# Patient Record
Sex: Female | Born: 1955 | Race: White | Hispanic: No | Marital: Married | Smoking: Never smoker
Health system: Southern US, Academic
[De-identification: ages and names within clinical notes are randomized; demographics above are authoritative.]

## PROBLEM LIST (undated history)

## (undated) DIAGNOSIS — M545 Low back pain, unspecified: Secondary | ICD-10-CM

## (undated) DIAGNOSIS — Z5189 Encounter for other specified aftercare: Secondary | ICD-10-CM

## (undated) DIAGNOSIS — R5383 Other fatigue: Secondary | ICD-10-CM

## (undated) DIAGNOSIS — N390 Urinary tract infection, site not specified: Secondary | ICD-10-CM

## (undated) DIAGNOSIS — R3915 Urgency of urination: Secondary | ICD-10-CM

## (undated) DIAGNOSIS — R10A0 Flank pain, unspecified side: Secondary | ICD-10-CM

## (undated) DIAGNOSIS — R001 Bradycardia, unspecified: Secondary | ICD-10-CM

## (undated) DIAGNOSIS — R109 Unspecified abdominal pain: Secondary | ICD-10-CM

## (undated) DIAGNOSIS — E875 Hyperkalemia: Secondary | ICD-10-CM

## (undated) DIAGNOSIS — U071 COVID-19: Secondary | ICD-10-CM

## (undated) DIAGNOSIS — R110 Nausea: Secondary | ICD-10-CM

## (undated) DIAGNOSIS — Z9889 Other specified postprocedural states: Secondary | ICD-10-CM

## (undated) DIAGNOSIS — R0789 Other chest pain: Secondary | ICD-10-CM

## (undated) DIAGNOSIS — M25569 Pain in unspecified knee: Secondary | ICD-10-CM

## (undated) HISTORY — DX: Other fatigue: R53.83

## (undated) HISTORY — DX: Hyperkalemia: E87.5

## (undated) HISTORY — DX: Other specified postprocedural states: Z98.890

## (undated) HISTORY — DX: Nausea: R11.0

## (undated) HISTORY — DX: Pain in unspecified knee: M25.569

## (undated) HISTORY — DX: Flank pain, unspecified side: R10.A0

## (undated) HISTORY — DX: Other chest pain: R07.89

## (undated) HISTORY — DX: Urinary tract infection, site not specified: N39.0

## (undated) HISTORY — DX: Urgency of urination: R39.15

## (undated) HISTORY — DX: Low back pain, unspecified: M54.50

## (undated) HISTORY — DX: Unspecified abdominal pain: R10.9

## (undated) HISTORY — DX: COVID-19: U07.1

## (undated) HISTORY — DX: Bradycardia, unspecified: R00.1

---

## 1988-06-16 HISTORY — PX: HX GALL BLADDER SURGERY/CHOLE: SHX55

## 1998-09-15 ENCOUNTER — Emergency Department (HOSPITAL_COMMUNITY): Payer: Self-pay

## 2003-06-17 HISTORY — PX: HX HYSTERECTOMY: SHX81

## 2003-06-17 HISTORY — PX: BILATERAL OOPHORECTOMY: SHX1221

## 2003-06-17 HISTORY — PX: HX DILATION AND CURETTAGE: SHX78

## 2003-06-17 HISTORY — PX: HX APPENDECTOMY: SHX54

## 2012-03-01 DIAGNOSIS — R42 Dizziness and giddiness: Secondary | ICD-10-CM | POA: Insufficient documentation

## 2012-03-01 DIAGNOSIS — R079 Chest pain, unspecified: Secondary | ICD-10-CM | POA: Insufficient documentation

## 2012-03-01 DIAGNOSIS — E669 Obesity, unspecified: Secondary | ICD-10-CM | POA: Insufficient documentation

## 2012-03-01 DIAGNOSIS — R5383 Other fatigue: Secondary | ICD-10-CM

## 2012-03-01 DIAGNOSIS — E661 Drug-induced obesity: Secondary | ICD-10-CM | POA: Insufficient documentation

## 2012-03-01 DIAGNOSIS — R0609 Other forms of dyspnea: Secondary | ICD-10-CM | POA: Insufficient documentation

## 2012-03-01 DIAGNOSIS — G4730 Sleep apnea, unspecified: Secondary | ICD-10-CM | POA: Insufficient documentation

## 2012-03-01 DIAGNOSIS — E785 Hyperlipidemia, unspecified: Secondary | ICD-10-CM | POA: Insufficient documentation

## 2012-03-01 DIAGNOSIS — E559 Vitamin D deficiency, unspecified: Secondary | ICD-10-CM | POA: Insufficient documentation

## 2012-03-01 DIAGNOSIS — Z8249 Family history of ischemic heart disease and other diseases of the circulatory system: Secondary | ICD-10-CM | POA: Insufficient documentation

## 2012-03-01 DIAGNOSIS — R5381 Other malaise: Secondary | ICD-10-CM | POA: Insufficient documentation

## 2012-03-01 DIAGNOSIS — R9431 Abnormal electrocardiogram [ECG] [EKG]: Secondary | ICD-10-CM | POA: Insufficient documentation

## 2012-03-01 DIAGNOSIS — R0683 Snoring: Secondary | ICD-10-CM | POA: Insufficient documentation

## 2012-03-01 HISTORY — DX: Other fatigue: R53.83

## 2012-03-01 HISTORY — DX: Snoring: R06.83

## 2012-03-01 HISTORY — DX: Other malaise: R53.81

## 2012-03-01 HISTORY — DX: Dizziness and giddiness: R42

## 2012-03-01 HISTORY — DX: Chest pain, unspecified: R07.9

## 2012-03-01 HISTORY — DX: Other forms of dyspnea: R06.09

## 2012-03-01 HISTORY — DX: Family history of ischemic heart disease and other diseases of the circulatory system: Z82.49

## 2018-04-15 DIAGNOSIS — I10 Essential (primary) hypertension: Secondary | ICD-10-CM | POA: Insufficient documentation

## 2018-04-15 DIAGNOSIS — I350 Nonrheumatic aortic (valve) stenosis: Secondary | ICD-10-CM | POA: Insufficient documentation

## 2018-04-15 DIAGNOSIS — G4710 Hypersomnia, unspecified: Secondary | ICD-10-CM | POA: Insufficient documentation

## 2018-08-11 IMAGING — MR MRI JOINT OF LOWER EXTREMITY WITHOUT CONTRAST LT
6 series · 40 of 40 positions shown · non-contrast
Comparison: None.

EXAM:  MRI JOINT OF LOWER EXTREMITY WITHOUT CONTRAST LT KNEE
INDICATION: Left knee pain, fall several months ago.
TECHNIQUE: Noncontrast multiplanar, multisequential MRI of the left knee joint was performed.

[Series 7: s-map · axial · left · 7.8mm · 3.91mm/px · z∈[-138,+166]mm · 15 of 80 slices shown]
[im 1/80]
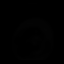
[im 6/80]
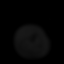
[im 12/80]
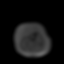
[im 17/80]
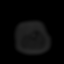
[im 23/80]
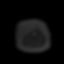
[im 29/80]
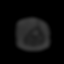
[im 34/80]
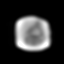
[im 40/80]
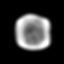
[im 46/80]
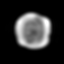
[im 51/80]
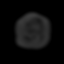
[im 57/80]
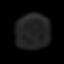
[im 63/80]
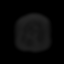
[im 68/80]
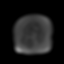
[im 74/80]
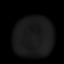
[im 80/80]
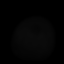

[Series 8: PD fat-sat · axial · left · 4.0mm · 0.37mm/px · z∈[-42,+89]mm · 5 of 30 slices shown (1 of 3)]
[im 1/30]
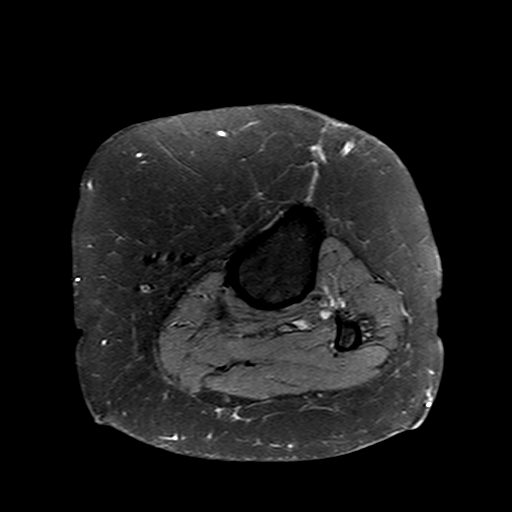
[im 8/30]
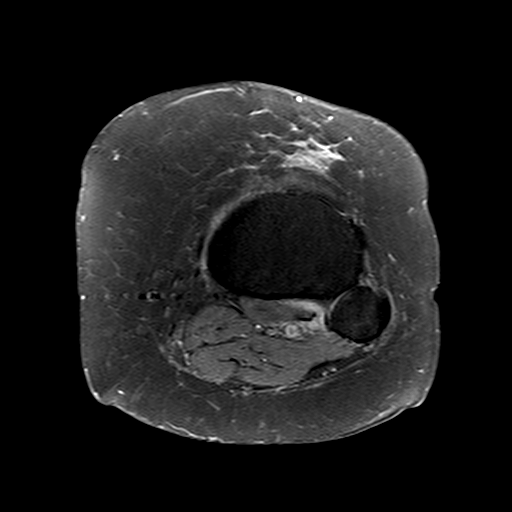
[im 15/30]
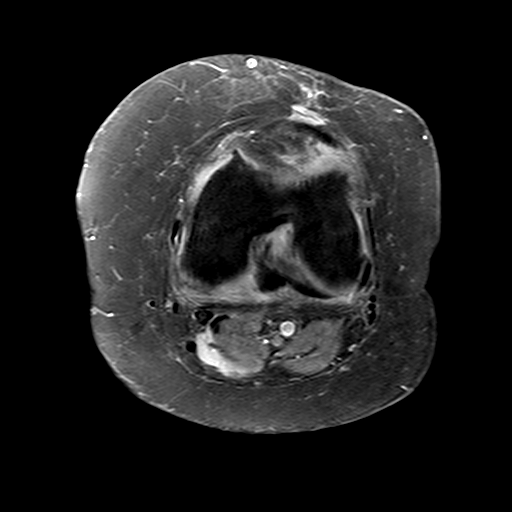
[im 22/30]
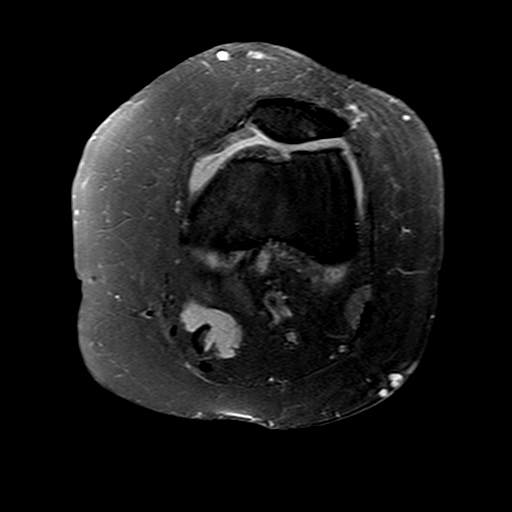
[im 30/30]
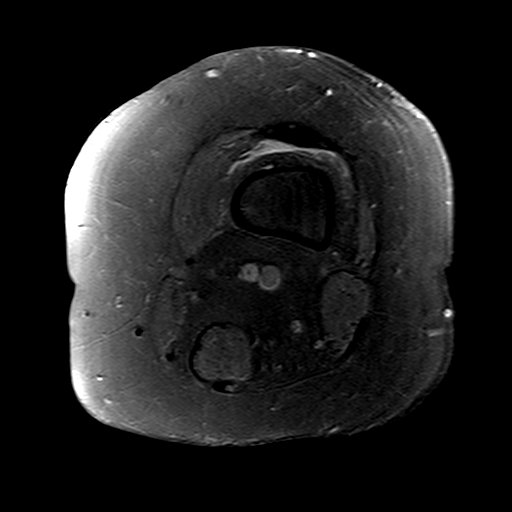

[Series 9: T1 · sagittal · left · 3.0mm · 0.42mm/px · 5 of 30 slices shown]
[im 1/30]
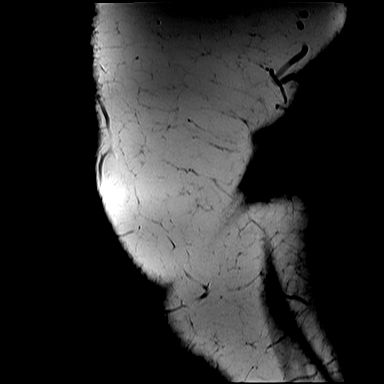
[im 8/30]
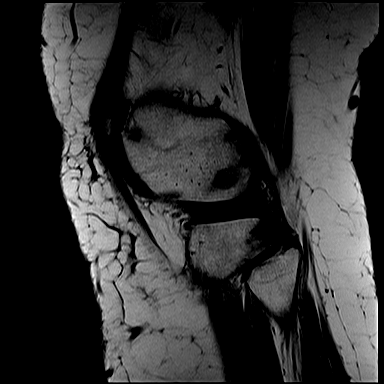
[im 15/30]
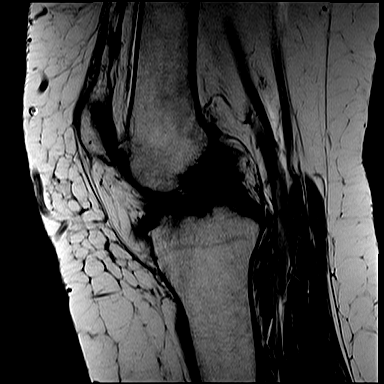
[im 22/30]
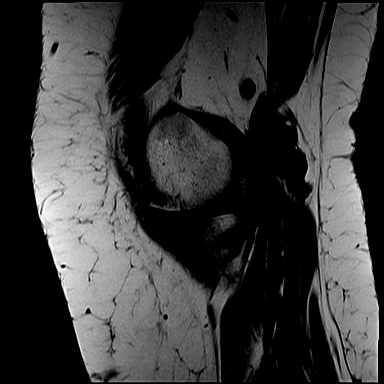
[im 30/30]
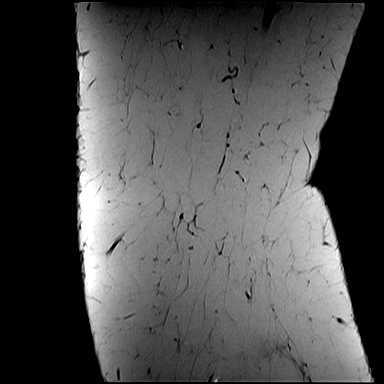

[Series 10: PD fat-sat · sagittal · left · 3.0mm · 0.50mm/px · 5 of 30 slices shown (2 of 3)]
[im 1/30]
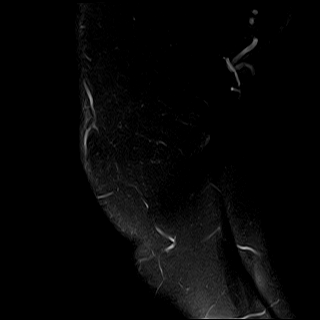
[im 8/30]
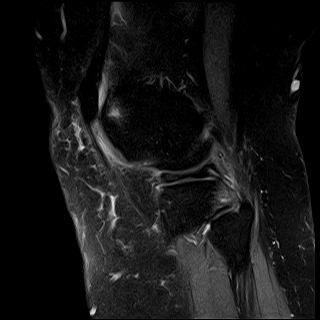
[im 15/30]
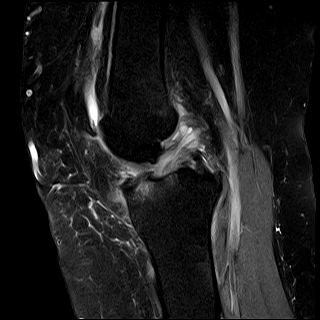
[im 22/30]
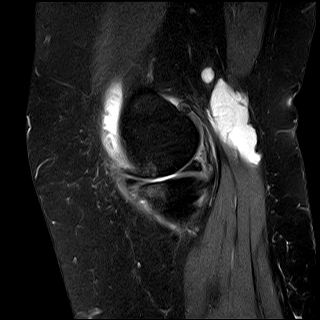
[im 30/30]
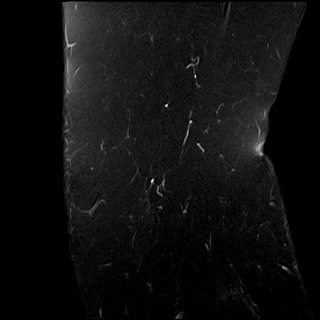

[Series 11: STIR · coronal · left · 3.0mm · 0.50mm/px · 5 of 29 slices shown]
[im 1/29]
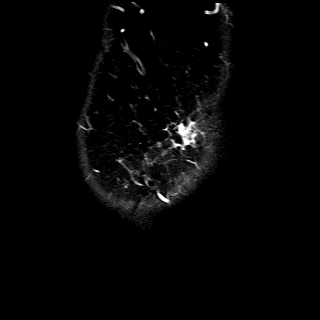
[im 8/29]
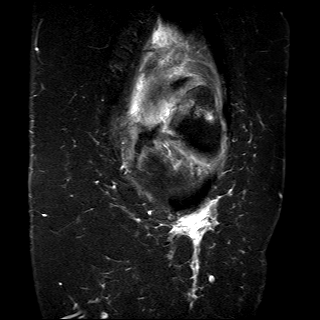
[im 15/29]
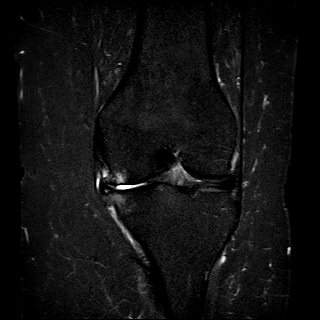
[im 22/29]
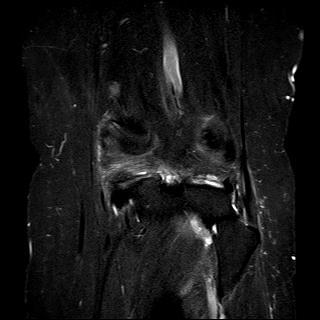
[im 29/29]
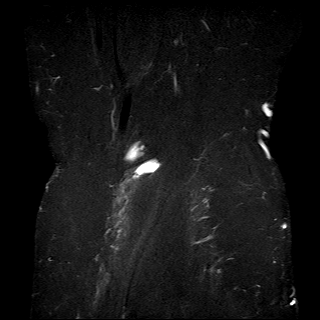

[Series 12: PD fat-sat · coronal · left · 3.0mm · 0.50mm/px · 5 of 29 slices shown (3 of 3)]
[im 1/29]
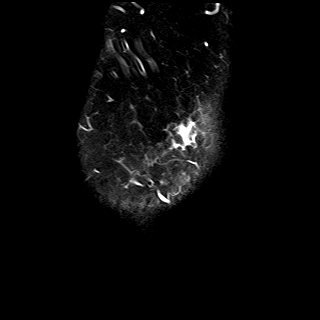
[im 8/29]
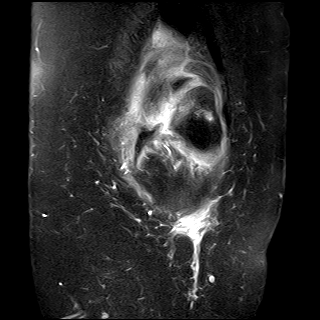
[im 15/29]
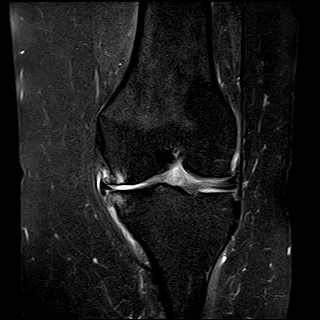
[im 22/29]
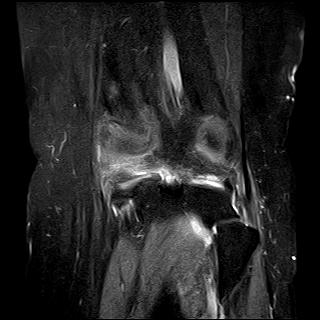
[im 29/29]
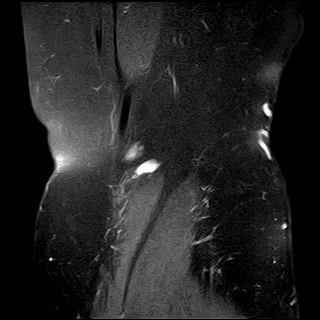

[40 of 40 positions shown; findings below may reference images not displayed]

FINDINGS: There is a complex medial meniscus tear.  There is also suggestion of a subtle tear involving the anterior horn of the lateral meniscus.  Cruciate and collateral ligaments are intact, within normal limits in morphology and signal intensity.  There is grade 4 chondromalacia of the medial tibiofemoral and patellofemoral articulations.  Mild subarticular cystic changes are also noted within the femoral trochlea and lateral patellar facet.  Extensor mechanism is intact.  Capsular attachments appear unremarkable.  There is a small suprapatellar effusion.  There is also a 7 cm Baker's cyst.
IMPRESSION: A complex medial meniscus tear and suggestion of a subtle tear involving the anterior horn of the lateral meniscus. 

Grade 4 chondromalacia of the medial tibiofemoral and patellofemoral articulations.  All these findings appear to be chronic in nature.

## 2018-08-11 IMAGING — MR MRI JOINT OF LOWER EXTREMITY WITHOUT CONTRAST RT
4 of 6 series · 22 of 40 positions shown · non-contrast
Comparison: None.

EXAM:  MRI JOINT OF LOWER EXTREMITY WITHOUT CONTRAST RT ANKLE
INDICATION: Right ankle pain, fall several months ago.
TECHNIQUE: Noncontrast multiplanar, multisequential MRI of the right ankle joint was performed.

[Series 10: T1 · axial · right · 4.5mm · 0.29mm/px · z∈[-64,+71]mm · 8 of 28 slices shown (1 of 3)]
[im 1/28]
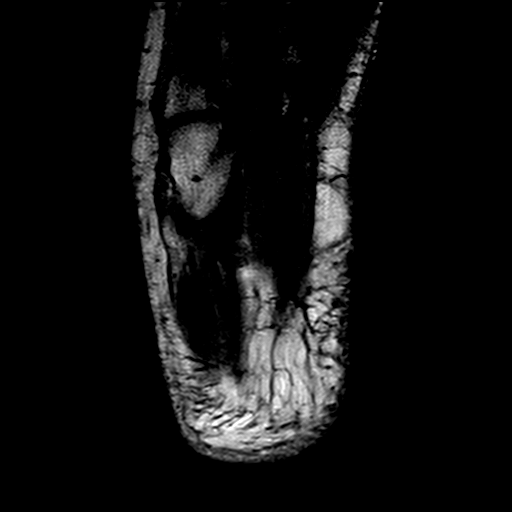
[im 4/28]
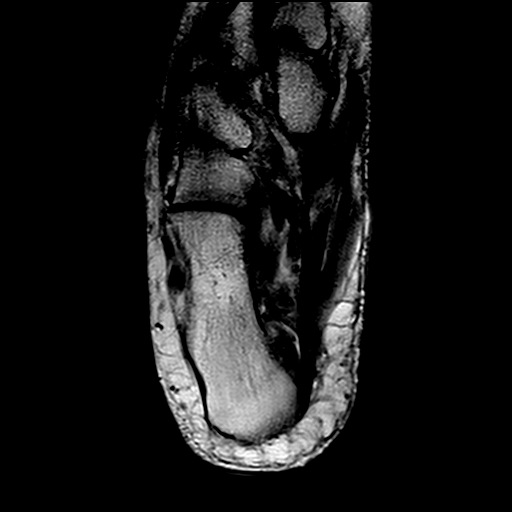
[im 8/28]
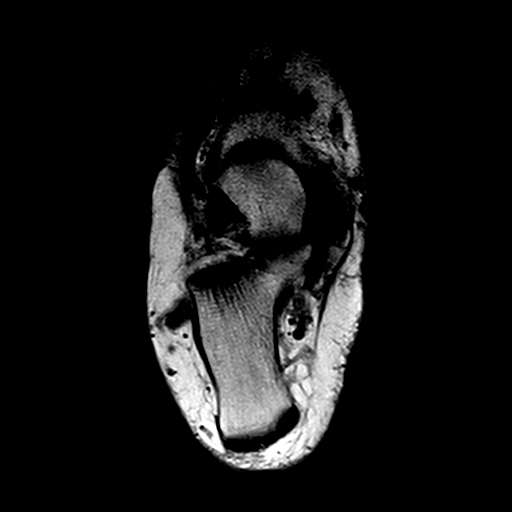
[im 12/28]
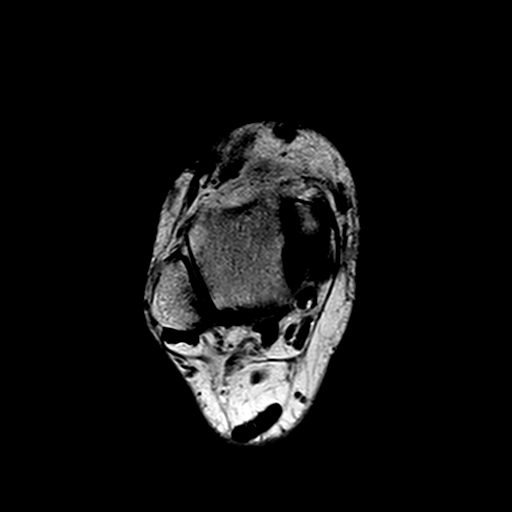
[im 16/28]
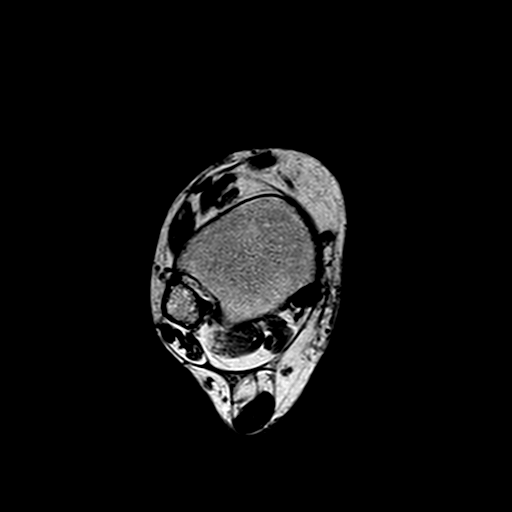
[im 20/28]
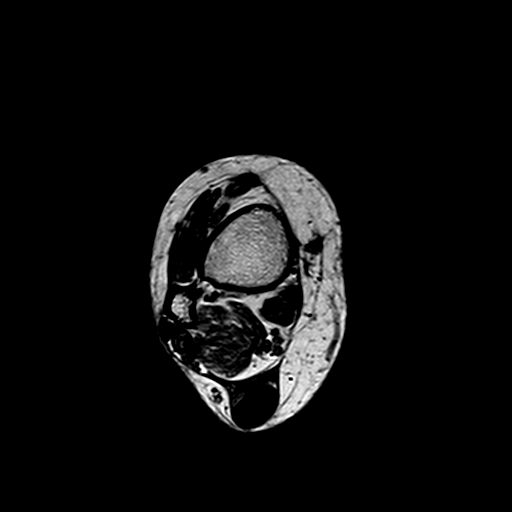
[im 24/28]
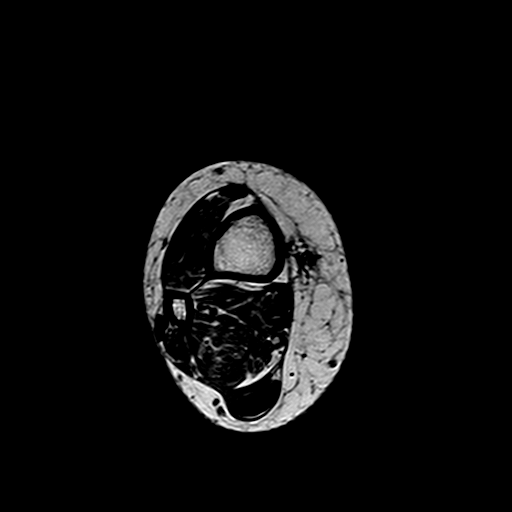
[im 28/28]
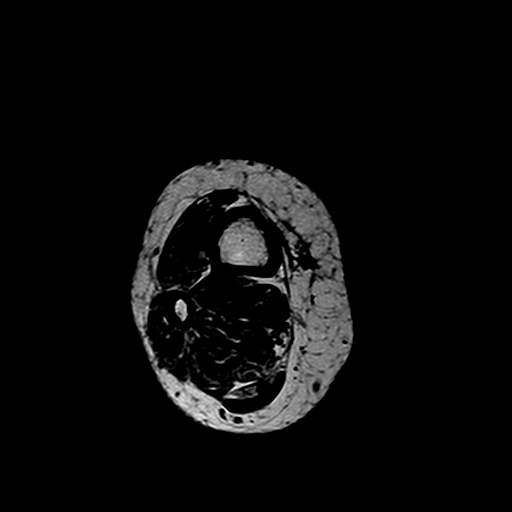

[Series 12: T2 fat-sat · axial · right · 4.5mm · 0.33mm/px · z∈[-64,+71]mm · 8 of 28 slices shown]
[im 1/28]
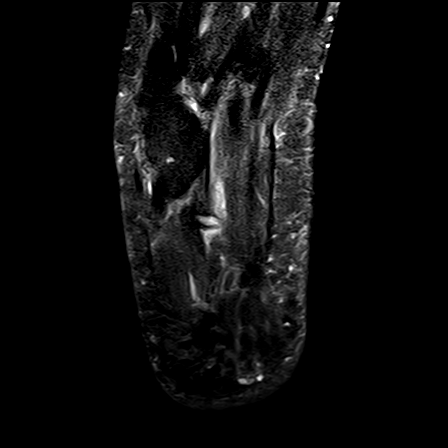
[im 4/28]
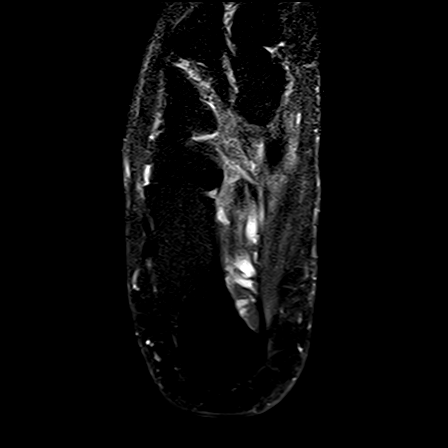
[im 8/28]
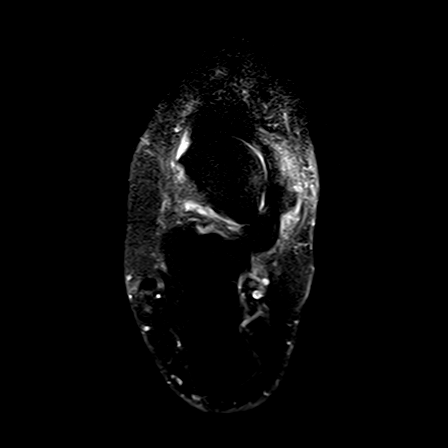
[im 12/28]
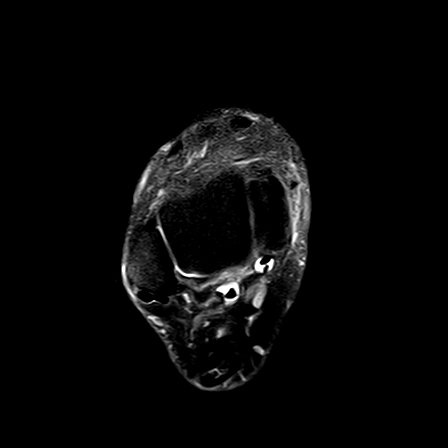
[im 16/28]
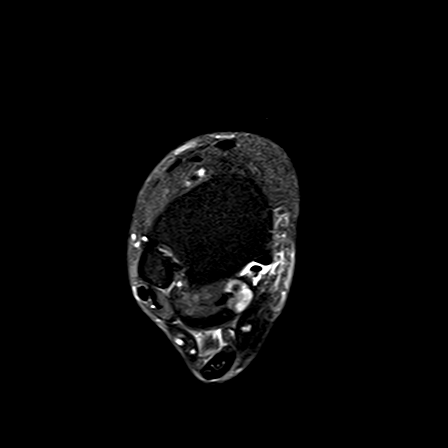
[im 20/28]
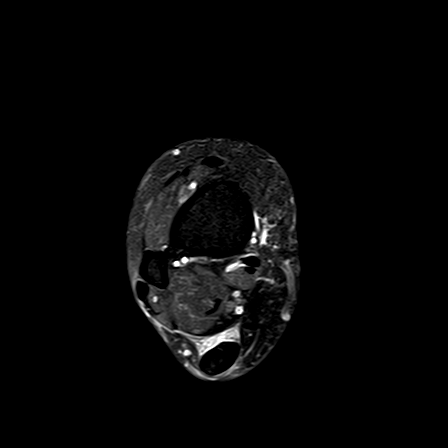
[im 24/28]
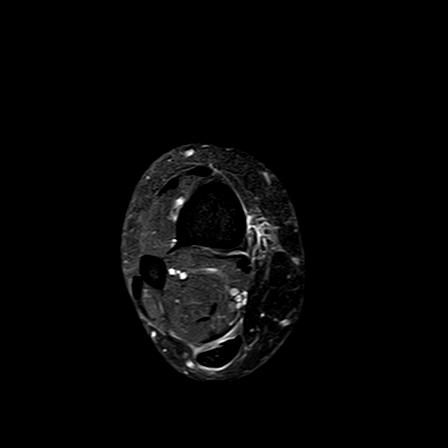
[im 28/28]
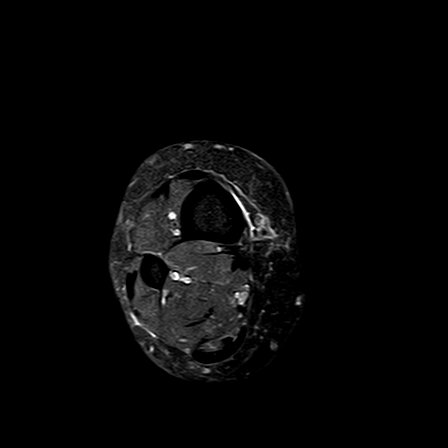

[Series 13: T1 · sagittal · right · 3.5mm · 0.36mm/px · 3 of 20 slices shown (2 of 3)]
[im 1/20]
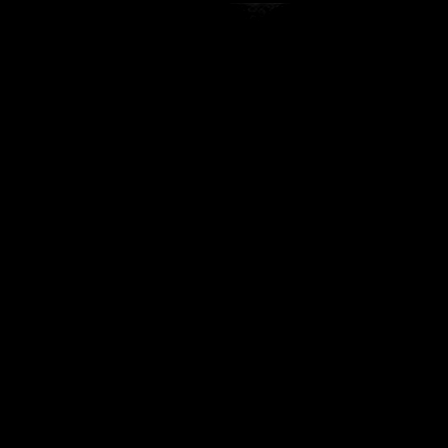
[im 10/20]
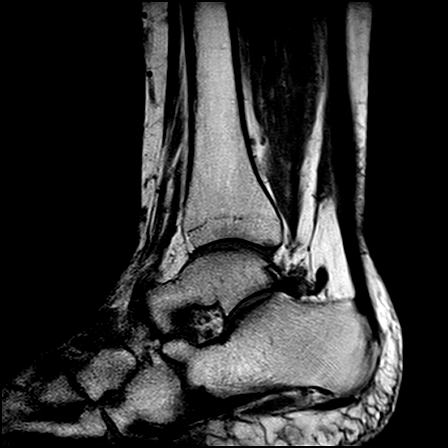
[im 20/20]
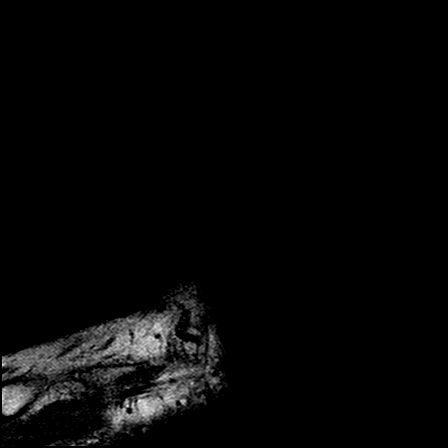

[Series 15: T1 · coronal · right · 4.0mm · 0.29mm/px · 3 of 26 slices shown (3 of 3)]
[im 5/26]
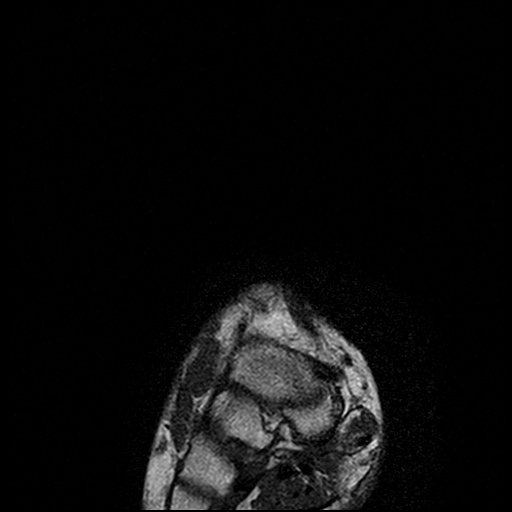
[im 13/26]
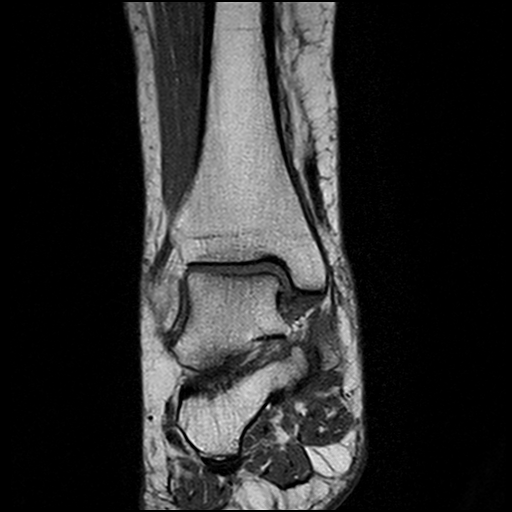
[im 21/26]
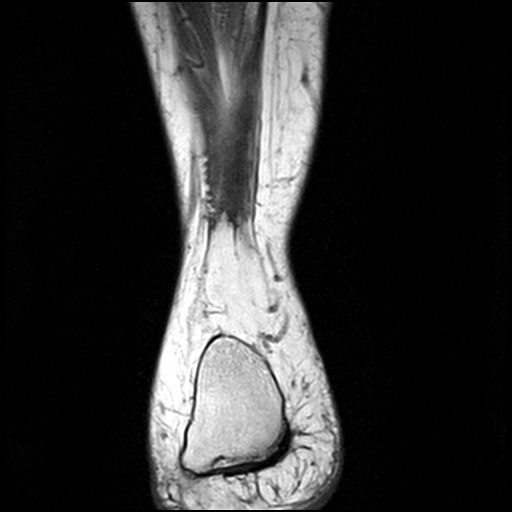

[22 of 40 positions shown; findings below may reference images not displayed]

FINDINGS: Bone marrow signal intensity is normal.  There is no acute fracture or subluxation.  There is no osteochondral lesion of the talar dome.  There is abnormal low T1 signal intensity within the sinus tarsi.  A small amount of fluid is noted within flexor digitorum and flexor hallucis tendon sheaths.  Peroneal and extensor tendons are normal.  Inferior syndesmotic, talofibular, calcaneofibular and deltoid ligaments are also intact.  There is moderate midfoot osteoarthritis.  There is abnormal thickening of the mid Achilles tendon.  Visualized plantar aponeurosis is unremarkable without fasciitis, fibromatosis or tear.
IMPRESSION: Flexor digitorum and flexor hallucis tenosynovitis.  

Intrasubstance degeneration of the mid Achilles tendon.  

Abnormal low T1 signal intensity within the sinus tarsi suggestive of sinus tarsi syndrome. 

Moderate midfoot osteoarthritis.  All these findings are felt to be chronic in nature.

## 2019-03-30 HISTORY — PX: HX KNEE REPLACMENT: SHX125

## 2021-08-16 ENCOUNTER — Encounter (RURAL_HEALTH_CENTER): Payer: Self-pay | Admitting: Family

## 2021-08-20 ENCOUNTER — Ambulatory Visit (RURAL_HEALTH_CENTER): Payer: Self-pay | Admitting: Family

## 2021-09-18 ENCOUNTER — Ambulatory Visit (RURAL_HEALTH_CENTER): Payer: Self-pay | Admitting: Family

## 2021-10-01 ENCOUNTER — Ambulatory Visit: Payer: No Typology Code available for payment source | Attending: Family | Admitting: Family

## 2021-10-01 ENCOUNTER — Encounter (RURAL_HEALTH_CENTER): Payer: Self-pay | Admitting: Family

## 2021-10-01 ENCOUNTER — Other Ambulatory Visit: Payer: Self-pay

## 2021-10-01 ENCOUNTER — Ambulatory Visit (RURAL_HEALTH_CENTER): Payer: No Typology Code available for payment source | Attending: Family | Admitting: Family

## 2021-10-01 VITALS — BP 126/84 | HR 50 | Temp 97.5°F | Resp 19 | Ht 66.0 in | Wt 205.4 lb

## 2021-10-01 DIAGNOSIS — F419 Anxiety disorder, unspecified: Secondary | ICD-10-CM | POA: Insufficient documentation

## 2021-10-01 DIAGNOSIS — E669 Obesity, unspecified: Secondary | ICD-10-CM | POA: Insufficient documentation

## 2021-10-01 DIAGNOSIS — I10 Essential (primary) hypertension: Secondary | ICD-10-CM | POA: Insufficient documentation

## 2021-10-01 DIAGNOSIS — E782 Mixed hyperlipidemia: Secondary | ICD-10-CM | POA: Insufficient documentation

## 2021-10-01 DIAGNOSIS — Z713 Dietary counseling and surveillance: Secondary | ICD-10-CM | POA: Insufficient documentation

## 2021-10-01 DIAGNOSIS — E785 Hyperlipidemia, unspecified: Secondary | ICD-10-CM | POA: Insufficient documentation

## 2021-10-01 DIAGNOSIS — Z7182 Exercise counseling: Secondary | ICD-10-CM | POA: Insufficient documentation

## 2021-10-01 DIAGNOSIS — E559 Vitamin D deficiency, unspecified: Secondary | ICD-10-CM | POA: Insufficient documentation

## 2021-10-01 DIAGNOSIS — E538 Deficiency of other specified B group vitamins: Secondary | ICD-10-CM | POA: Insufficient documentation

## 2021-10-01 DIAGNOSIS — G4730 Sleep apnea, unspecified: Secondary | ICD-10-CM | POA: Insufficient documentation

## 2021-10-01 DIAGNOSIS — Z6833 Body mass index (BMI) 33.0-33.9, adult: Secondary | ICD-10-CM | POA: Insufficient documentation

## 2021-10-01 HISTORY — DX: Anxiety disorder, unspecified: F41.9

## 2021-10-01 MED ORDER — CYANOCOBALAMIN (VIT B-12) 1,000 MCG/ML INJECTION SOLUTION
1000.0000 ug | INTRAMUSCULAR | Status: AC
Start: 2021-10-01 — End: 2021-10-01
  Administered 2021-10-01: 1000 ug via SUBCUTANEOUS

## 2021-10-01 MED ORDER — SPIRONOLACTONE 25 MG TABLET
25.0000 mg | ORAL_TABLET | Freq: Every day | ORAL | 1 refills | Status: DC
Start: 2021-10-01 — End: 2022-01-23

## 2021-10-01 MED ORDER — CETIRIZINE 10 MG TABLET
10.0000 mg | ORAL_TABLET | Freq: Every day | ORAL | 1 refills | Status: DC
Start: 2021-10-01 — End: 2022-01-02

## 2021-10-01 MED ORDER — HYDROCHLOROTHIAZIDE 12.5 MG CAPSULE
12.5000 mg | ORAL_CAPSULE | Freq: Every day | ORAL | 1 refills | Status: DC
Start: 2021-10-01 — End: 2022-01-02

## 2021-10-01 MED ORDER — LISINOPRIL 5 MG TABLET
5.0000 mg | ORAL_TABLET | Freq: Every day | ORAL | 1 refills | Status: DC
Start: 2021-10-01 — End: 2022-01-23

## 2021-10-01 MED ORDER — CHOLECALCIFEROL (VITAMIN D3) 125 MCG (5,000 UNIT) TABLET
5000.0000 [IU] | ORAL_TABLET | Freq: Every day | ORAL | 1 refills | Status: DC
Start: 2021-10-01 — End: 2022-01-23

## 2021-10-01 NOTE — Progress Notes (Signed)
FAMILY MEDICINE, [place]  [place] [id number]  Operated by [facility]     Name: Kelsey Pitts MRN:  [id number]   Date of Birth: 10/26/1955 Age: 66 y.o.   Date: 10/01/2021  Time: 13:41     Provider: Jodi Mourning, NP-C    Reason for visit: Follow Up 3 Months      History of Present Illness:  Kelsey Pitts is a 66 y.o. female presenting with chronic disease management.    Patient Active Problem List    Diagnosis Date Noted   . Essential hypertension 10/01/2021     Dr. Ashby Dawes, Cardiologist, [place], [place].     Marland Kitchen Mixed hyperlipidemia 10/01/2021   . Vitamin D deficiency 10/01/2021   . B12 deficiency 10/01/2021   . Sleep apnea 10/01/2021   . Anxiety 10/01/2021   . Obesity (BMI 30.0-34.9) 10/01/2021       Historical Data    Past Medical History:  Past Medical History:   Diagnosis Date   . Bradycardia    . Chest discomfort    . Clinical diagnosis of severe acute respiratory syndrome coronavirus 2 (SARS-CoV-2) disease    . Fatigue    . Flank pain    . Hypokaluria    . Knee pain    . Low back pain    . Postoperative nausea    . Urinary urgency    . UTI (urinary tract infection)      Past Surgical History:  Past Surgical History:   Procedure Laterality Date   . BILATERAL OOPHORECTOMY N/A 2005    Dr Maureen Ralphs   . CESAREAN SECTION, CLASSIC N/A 1980    Another one in 37   . HX APPENDECTOMY N/A 2005    Dr Venia Minks   . HX CHOLECYSTECTOMY  1990    Dr Renette Butters   . HX DILATION AND CURETTAGE N/A 2005    Dr Maureen Ralphs   . HX HYSTERECTOMY N/A 2005    Dr Maureen Ralphs   . 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 Calcium Carbonate 500 mg calcium (1,250 mg) Oral Tablet, Chewable every day   . cetirizine (ZYRTEC) 10 mg Oral Tablet Take 1 Tablet (10 mg total) by mouth Once a day for 90 days   . cholecalciferol, Vitamin D3, 125 mcg (5,000 unit) Oral Tablet Take 1 Tablet  (5,000 Units total) by mouth Once a day for 90 days   . cyanocobalamin (VITAMIN B12) 1,000 mcg/mL Injection Solution Inject 1 mL (1,000 mcg total) into the muscle   . ergocalciferol, vitamin D2, (DRISDOL) 1,250 mcg (50,000 unit) Oral Capsule Take 1 Capsule (50,000 Units total) by mouth   . fluticasone propionate (FLONASE) 50 mcg/actuation Nasal Spray, Suspension Administer 1 Spray into each nostril Once a day   . hydroCHLOROthiazide (MICROZIDE) 12.5 mg Oral Capsule Take 1 Capsule (12.5 mg total) by mouth Once a day for 90 days   . lisinopriL (PRINIVIL) 5 mg Oral Tablet Take 1 Tablet (5 mg total) by mouth Once a day for 90 days   . spironolactone (ALDACTONE) 25 mg Oral Tablet Take 1 Tablet (25 mg total) by mouth Once a day for 90 days     Family History:  Family Medical History:     Problem Relation (Age of Onset)    Atrial fibrillation Sister  Congestive Heart Failure Mother    Diabetes type II Mother    Emphysema Mother    Heart Attack Father    Heart Disease Mother, Father, Sister    Hypertension (High Blood Pressure) Mother, Father    No Known Problems Brother, Half-Sister, Half-Brother, Maternal Aunt, Maternal Uncle, Paternal 57, Paternal Uncle, Maternal Grandmother, Maternal Grandfather, Paternal 24, Paternal 56, Daughter, Son    Pacemaker Sister          Social History:  Social History     Socioeconomic History   . Marital status: Married     Spouse name: Jhovana Nell   . Number of children: 3   Occupational History     Comment: [employer]; housekeeping.   Tobacco Use   . Smoking status: Never   . Smokeless tobacco: Never   Vaping Use   . Vaping Use: Never used   Substance and Sexual Activity   . Alcohol use: Never   . Drug use: Never   . Sexual activity: Never     Partners: Male           Review of Systems:  Any pertinent Review of Systems as addressed in the HPI above.    Physical Exam:  Vital Signs:  Vitals:    10/01/21 1317   BP: 126/84   Pulse: 50   Resp: 19   Temp: 36.4 C  (97.5 F)   SpO2: 99%   Weight: 93.2 kg (205 lb 6 oz)   Height: 1.676 m (5\' 6" )   BMI: 33.22     Physical Exam  Constitutional:       Appearance: She is obese.   HENT:      Head: Normocephalic.      Right Ear: Tympanic membrane normal.      Left Ear: Tympanic membrane normal.      Nose: Nose normal.      Mouth/Throat:      Mouth: Mucous membranes are dry.      Pharynx: Oropharynx is clear.   Eyes:      Extraocular Movements: Extraocular movements intact.      Conjunctiva/sclera: Conjunctivae normal.   Cardiovascular:      Rate and Rhythm: Normal rate and regular rhythm.      Pulses: Normal pulses.      Heart sounds: Normal heart sounds.   Pulmonary:      Effort: Pulmonary effort is normal.      Breath sounds: Normal breath sounds.   Abdominal:      General: Bowel sounds are normal.      Palpations: Abdomen is soft.   Musculoskeletal:         General: Normal range of motion.      Cervical back: Neck supple.   Skin:     General: Skin is warm and dry.   Neurological:      General: No focal deficit present.      Mental Status: She is alert and oriented to person, place, and time.   Psychiatric:         Mood and Affect: Mood normal.         Behavior: Behavior normal.         Thought Content: Thought content normal.         Judgment: Judgment normal.          Assessment/Plan:  (I10) Essential hypertension  (primary encounter diagnosis)  Plan: CBC, COMPREHENSIVE METABOLIC PNL, FASTING,         LIPID PANEL,  MAGNESIUM, THYROID STIMULATING         HORMONE (SENSITIVE TSH), VITAMIN D 25 TOTAL,         VITAMIN B12    (E78.2) Mixed hyperlipidemia  Plan: CBC, COMPREHENSIVE METABOLIC PNL, FASTING,         LIPID PANEL, MAGNESIUM, THYROID STIMULATING         HORMONE (SENSITIVE TSH), VITAMIN D 25 TOTAL,         VITAMIN B12    (E55.9) Vitamin D deficiency  Plan: CBC, COMPREHENSIVE METABOLIC PNL, FASTING,         LIPID PANEL, MAGNESIUM, THYROID STIMULATING         HORMONE (SENSITIVE TSH), VITAMIN D 25 TOTAL,         VITAMIN  B12    (E53.8) B12 deficiency  Plan: CBC, COMPREHENSIVE METABOLIC PNL, FASTING,         LIPID PANEL, MAGNESIUM, THYROID STIMULATING         HORMONE (SENSITIVE TSH), VITAMIN D 25 TOTAL,         VITAMIN B12    (G47.30) Sleep apnea, unspecified type  Plan: CBC, COMPREHENSIVE METABOLIC PNL, FASTING,         LIPID PANEL, MAGNESIUM, THYROID STIMULATING         HORMONE (SENSITIVE TSH), VITAMIN D 25 TOTAL,         VITAMIN B12    (F41.9) Anxiety  Plan: CBC, COMPREHENSIVE METABOLIC PNL, FASTING,         LIPID PANEL, MAGNESIUM, THYROID STIMULATING         HORMONE (SENSITIVE TSH), VITAMIN D 25 TOTAL,         VITAMIN B12    (E66.9) Obesity (BMI 30.0-34.9)  Plan: CBC, COMPREHENSIVE METABOLIC PNL, FASTING,         LIPID PANEL, MAGNESIUM, THYROID STIMULATING         HORMONE (SENSITIVE TSH), VITAMIN D 25 TOTAL,         VITAMIN B12       Labs drawn today.  Continue current medications.  Advised a low-fat/low sodium diet, advised 150 minutes of scheduled weekly physical activity as tolerated.  Advised to maintain a healthy weight.      Return in about 3 months (around 12/31/2021) for routine visit; schedule adult wellness visit.    Jodi Mourning, NP-C     Portions of this note may be dictated using voice recognition software or a dictation service. Variances in spelling and vocabulary are possible and unintentional. Not all errors are caught/corrected. Please notify the [facility] if any discrepancies are noted or if the meaning of any statement is not clear.

## 2021-10-01 NOTE — Nursing Note (Signed)
Patient is here for 3 month follow up with medication refills and no new concerns.

## 2021-10-02 LAB — COMPREHENSIVE METABOLIC PNL, FASTING
ALBUMIN/GLOBULIN RATIO: 1.8 — ABNORMAL HIGH (ref 0.8–1.4)
ALBUMIN: 4.2 g/dL (ref 3.5–5.7)
ALKALINE PHOSPHATASE: 47 U/L (ref 34–104)
ALT (SGPT): 12 U/L (ref 7–52)
ANION GAP: 5 mmol/L — ABNORMAL LOW (ref 10–20)
AST (SGOT): 17 U/L (ref 13–39)
BILIRUBIN TOTAL: 0.6 mg/dL (ref 0.3–1.2)
BUN/CREA RATIO: 22 (ref 6–22)
BUN: 14 mg/dL (ref 7–25)
CALCIUM, CORRECTED: 9.1 mg/dL (ref 8.9–10.8)
CALCIUM: 9.3 mg/dL (ref 8.6–10.3)
CHLORIDE: 107 mmol/L (ref 98–107)
CO2 TOTAL: 28 mmol/L (ref 21–31)
CREATININE: 0.65 mg/dL (ref 0.60–1.30)
ESTIMATED GFR: 98 mL/min/{1.73_m2} (ref >59)
GLOBULIN: 2.3 — ABNORMAL LOW (ref 2.9–5.4)
GLUCOSE: 98 mg/dL (ref 74–109)
OSMOLALITY, CALCULATED: 280 mOsm/kg (ref 270–290)
POTASSIUM: 4 mmol/L (ref 3.5–5.1)
PROTEIN TOTAL: 6.5 g/dL (ref 6.4–8.9)
SODIUM: 140 mmol/L (ref 136–145)

## 2021-10-02 LAB — CBC
HCT: 38.9 % (ref 37.0–47.0)
HGB: 13.2 g/dL (ref 12.5–16.0)
MCH: 30 pg (ref 27.0–32.0)
MCHC: 33.9 g/dL (ref 32.0–36.0)
MCV: 88.4 fL (ref 78.0–99.0)
MPV: 9.3 fL (ref 7.4–10.4)
PLATELETS: 220 10*3/uL (ref 140–440)
RBC: 4.4 10*6/uL (ref 4.20–5.40)
RDW: 12.8 % (ref 11.6–14.8)
WBC: 4.7 10*3/uL (ref 4.0–10.5)
WBCS UNCORRECTED: 4.7 10*3/uL

## 2021-10-02 LAB — LIPID PANEL
CHOL/HDL RATIO: 2.8
CHOLESTEROL: 187 mg/dL (ref <200)
HDL CHOL: 67 mg/dL (ref 23–92)
LDL CALC: 106 mg/dL — ABNORMAL HIGH (ref 0–100)
TRIGLYCERIDES: 72 mg/dL (ref <=150)
VLDL CALC: 14 mg/dL (ref 0–50)

## 2021-10-02 LAB — VITAMIN B12: VITAMIN B 12: >1500 pg/mL — ABNORMAL HIGH (ref 180–914)

## 2021-10-02 LAB — VITAMIN D 25 TOTAL: VITAMIN D: 23 ng/mL — ABNORMAL LOW (ref 30–100)

## 2021-10-02 LAB — THYROID STIMULATING HORMONE (SENSITIVE TSH): TSH: 1.966 u[IU]/mL (ref 0.450–5.330)

## 2021-10-02 LAB — MAGNESIUM: MAGNESIUM: 2.4 mg/dL (ref 1.9–2.7)

## 2021-10-03 ENCOUNTER — Other Ambulatory Visit (RURAL_HEALTH_CENTER): Payer: Self-pay | Admitting: Family

## 2021-10-10 ENCOUNTER — Telehealth (RURAL_HEALTH_CENTER): Payer: Self-pay | Admitting: Family Medicine

## 2021-10-10 NOTE — Telephone Encounter (Signed)
-----   Message from Stormy Fabian, NP-C sent at 10/03/2021  8:01 AM EDT -----  Inform patient labs indicate her vitamin-D is still critically low.  Advised to restart weekly vitamin-D.  Prescription has been sent to her pharmacy.      Otherwise continue all other medications as prescribed.

## 2021-10-10 NOTE — Telephone Encounter (Signed)
Patient aware of labs.  

## 2021-12-31 ENCOUNTER — Ambulatory Visit (RURAL_HEALTH_CENTER): Payer: Self-pay | Admitting: Family

## 2022-01-02 ENCOUNTER — Encounter (RURAL_HEALTH_CENTER): Payer: Self-pay | Admitting: Family

## 2022-01-02 ENCOUNTER — Inpatient Hospital Stay
Admission: RE | Admit: 2022-01-02 | Discharge: 2022-01-02 | Disposition: A | Discharge status: Home / Self Care | Payer: No Typology Code available for payment source | Source: Ambulatory Visit | Attending: Family | Admitting: Family

## 2022-01-02 ENCOUNTER — Ambulatory Visit (HOSPITAL_COMMUNITY): Payer: No Typology Code available for payment source | Admitting: Family

## 2022-01-02 ENCOUNTER — Ambulatory Visit (RURAL_HEALTH_CENTER): Payer: No Typology Code available for payment source | Attending: Family | Admitting: Family

## 2022-01-02 ENCOUNTER — Other Ambulatory Visit: Payer: Self-pay

## 2022-01-02 VITALS — BP 126/84 | HR 54 | Temp 97.2°F | Resp 19 | Ht 66.0 in | Wt 201.2 lb

## 2022-01-02 DIAGNOSIS — E782 Mixed hyperlipidemia: Secondary | ICD-10-CM | POA: Insufficient documentation

## 2022-01-02 DIAGNOSIS — E669 Obesity, unspecified: Secondary | ICD-10-CM | POA: Insufficient documentation

## 2022-01-02 DIAGNOSIS — F419 Anxiety disorder, unspecified: Secondary | ICD-10-CM | POA: Insufficient documentation

## 2022-01-02 DIAGNOSIS — I10 Essential (primary) hypertension: Secondary | ICD-10-CM | POA: Insufficient documentation

## 2022-01-02 DIAGNOSIS — R42 Dizziness and giddiness: Secondary | ICD-10-CM | POA: Insufficient documentation

## 2022-01-02 DIAGNOSIS — Z6832 Body mass index (BMI) 32.0-32.9, adult: Secondary | ICD-10-CM | POA: Insufficient documentation

## 2022-01-02 DIAGNOSIS — R002 Palpitations: Secondary | ICD-10-CM | POA: Insufficient documentation

## 2022-01-02 DIAGNOSIS — J309 Allergic rhinitis, unspecified: Secondary | ICD-10-CM | POA: Insufficient documentation

## 2022-01-02 DIAGNOSIS — R001 Bradycardia, unspecified: Secondary | ICD-10-CM | POA: Insufficient documentation

## 2022-01-02 DIAGNOSIS — E559 Vitamin D deficiency, unspecified: Secondary | ICD-10-CM | POA: Insufficient documentation

## 2022-01-02 DIAGNOSIS — G4730 Sleep apnea, unspecified: Secondary | ICD-10-CM | POA: Insufficient documentation

## 2022-01-02 DIAGNOSIS — E538 Deficiency of other specified B group vitamins: Secondary | ICD-10-CM | POA: Insufficient documentation

## 2022-01-02 DIAGNOSIS — R9431 Abnormal electrocardiogram [ECG] [EKG]: Secondary | ICD-10-CM

## 2022-01-02 HISTORY — DX: Palpitations: R00.2

## 2022-01-02 HISTORY — DX: Bradycardia, unspecified: R00.1

## 2022-01-02 LAB — COMPREHENSIVE METABOLIC PNL, FASTING
ALBUMIN/GLOBULIN RATIO: 1.7 — ABNORMAL HIGH (ref 0.8–1.4)
ALBUMIN: 4.4 g/dL (ref 3.5–5.7)
ALKALINE PHOSPHATASE: 48 U/L (ref 34–104)
ALT (SGPT): 15 U/L (ref 7–52)
ANION GAP: 5 mmol/L — ABNORMAL LOW (ref 10–20)
AST (SGOT): 16 U/L (ref 13–39)
BILIRUBIN TOTAL: 0.9 mg/dL (ref 0.3–1.2)
BUN/CREA RATIO: 20 (ref 6–22)
BUN: 14 mg/dL (ref 7–25)
CALCIUM, CORRECTED: 9.5 mg/dL (ref 8.9–10.8)
CALCIUM: 9.9 mg/dL (ref 8.6–10.3)
CHLORIDE: 104 mmol/L (ref 98–107)
CO2 TOTAL: 31 mmol/L (ref 21–31)
CREATININE: 0.69 mg/dL (ref 0.60–1.30)
ESTIMATED GFR: 96 mL/min/{1.73_m2} (ref >59)
GLOBULIN: 2.6 — ABNORMAL LOW (ref 2.9–5.4)
GLUCOSE: 118 mg/dL — ABNORMAL HIGH (ref 74–109)
OSMOLALITY, CALCULATED: 281 mOsm/kg (ref 270–290)
POTASSIUM: 4 mmol/L (ref 3.5–5.1)
PROTEIN TOTAL: 7 g/dL (ref 6.4–8.9)
SODIUM: 140 mmol/L (ref 136–145)

## 2022-01-02 LAB — LIPID PANEL
CHOL/HDL RATIO: 2.9
CHOLESTEROL: 211 mg/dL — ABNORMAL HIGH (ref <200)
HDL CHOL: 72 mg/dL (ref 23–92)
LDL CALC: 126 mg/dL — ABNORMAL HIGH (ref 0–100)
TRIGLYCERIDES: 65 mg/dL (ref <=150)
VLDL CALC: 13 mg/dL (ref 0–50)

## 2022-01-02 LAB — VITAMIN B12: VITAMIN B 12: 576 pg/mL (ref 180–914)

## 2022-01-02 LAB — CBC
HCT: 41.8 % (ref 37.0–47.0)
HGB: 13.6 g/dL (ref 12.5–16.0)
MCH: 28.9 pg (ref 27.0–32.0)
MCHC: 32.6 g/dL (ref 32.0–36.0)
MCV: 88.5 fL (ref 78.0–99.0)
MPV: 8.6 fL (ref 7.4–10.4)
PLATELETS: 257 10*3/uL (ref 140–440)
RBC: 4.72 10*6/uL (ref 4.20–5.40)
RDW: 12.9 % (ref 11.6–14.8)
WBC: 5 10*3/uL (ref 4.0–10.5)
WBCS UNCORRECTED: 5 10*3/uL

## 2022-01-02 LAB — THYROID STIMULATING HORMONE (SENSITIVE TSH): TSH: 3.223 u[IU]/mL (ref 0.450–5.330)

## 2022-01-02 LAB — VITAMIN D 25 TOTAL: VITAMIN D: 32 ng/mL (ref 30–100)

## 2022-01-02 LAB — MAGNESIUM: MAGNESIUM: 2.3 mg/dL (ref 1.9–2.7)

## 2022-01-02 MED ORDER — DEXAMETHASONE SODIUM PHOSPHATE (PF) 10 MG/ML INJECTION SOLUTION
10.0000 mg | Freq: Once | INTRAMUSCULAR | Status: AC
Start: 2022-01-02 — End: 2022-01-02
  Administered 2022-01-02: 10 mg via INTRAMUSCULAR

## 2022-01-02 MED ORDER — FLUTICASONE PROPIONATE 50 MCG/ACTUATION NASAL SPRAY,SUSPENSION
1.0000 | Freq: Every day | NASAL | 3 refills | Status: AC
Start: 2022-01-02 — End: 2022-02-01

## 2022-01-02 MED ORDER — CETIRIZINE 10 MG TABLET
10.0000 mg | ORAL_TABLET | Freq: Every day | ORAL | 1 refills | Status: AC
Start: 2022-01-02 — End: 2022-04-02

## 2022-01-02 MED ORDER — ERGOCALCIFEROL (VITAMIN D2) 1,250 MCG (50,000 UNIT) CAPSULE
50000.0000 [IU] | ORAL_CAPSULE | ORAL | 1 refills | Status: AC
Start: 2022-01-02 — End: 2022-04-02

## 2022-01-02 NOTE — Addendum Note (Signed)
Addended by: Lamont Dowdy on: 01/02/2022 09:12 AM     Modules accepted: Orders

## 2022-01-02 NOTE — Nursing Note (Signed)
Patient is here for 3 month follow up without medication refills, patient states she has been lightheaded for the past few months.

## 2022-01-02 NOTE — Progress Notes (Addendum)
FAMILY MEDICINE, [facility] FAMILY MEDICINE [facility]  [address]  [place] [address]  Operated by [facility]     Name: Kelsey Pitts MRN:  [id number]   Date of Birth: 12-25-1955 Age: 66 y.o.   Date: 01/02/2022  Time: 09:11     Provider: Stormy Fabian, NP-C    Reason for visit: Follow Up 3 Months      History of Present Illness:  Kelsey Pitts is a 66 y.o. female presenting with chronic disease management.    Patient complains of near syncope episodes 3 times daily for the past 6-8 weeks.  Patient reports she has not passed out.  Patient reports she does have palpitations at time of episode and episode only last a few seconds and then resolves.  Denies chest pain or shortness of breath.      Patient Active Problem List    Diagnosis Date Noted   . Bradycardia 01/02/2022     Managed by [facility].     . Dizziness 01/02/2022   . Palpitations 01/02/2022   . Essential hypertension 10/01/2021     Dr. Nicholos Johns, Cardiologist, [place], [place].     Marland Kitchen Mixed hyperlipidemia 10/01/2021   . Vitamin D deficiency 10/01/2021   . B12 deficiency 10/01/2021   . Sleep apnea 10/01/2021   . Anxiety 10/01/2021   . Obesity (BMI 30.0-34.9) 10/01/2021       Historical Data    Past Medical History:  Past Medical History:   Diagnosis Date   . Bradycardia    . Chest discomfort    . Clinical diagnosis of severe acute respiratory syndrome coronavirus 2 (SARS-CoV-2) disease    . Fatigue    . Flank pain    . Hypokaluria    . Knee pain    . Low back pain    . Postoperative nausea    . Urinary urgency    . UTI (urinary tract infection)      Past Surgical History:  Past Surgical History:   Procedure Laterality Date   . BILATERAL OOPHORECTOMY N/A 2005    Dr Jaci Standard   . CESAREAN SECTION, CLASSIC N/A 1980    Another one in 70   . HX APPENDECTOMY N/A 2005    Dr Sabino Gasser   . HX CHOLECYSTECTOMY  1990    Dr Donnal Debar   . HX DILATION AND CURETTAGE N/A 2005    Dr Jaci Standard   . HX HYSTERECTOMY N/A 2005    Dr  Jaci Standard   . 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 Calcium Carbonate 500 mg calcium (1,250 mg) Oral Tablet, Chewable every day   . cetirizine (ZYRTEC) 10 mg Oral Tablet Take 1 Tablet (10 mg total) by mouth Once a day for 90 days   . cyanocobalamin (VITAMIN B12) 1,000 mcg/mL Injection Solution Inject 1 mL (1,000 mcg total) into the muscle   . ergocalciferol, vitamin D2, (DRISDOL) 1,250 mcg (50,000 unit) Oral Capsule Take 1 Capsule (50,000 Units total) by mouth Every 7 days for 90 days   . fluticasone propionate (FLONASE) 50 mcg/actuation Nasal Spray, Suspension Administer 1 Spray into each nostril Once a day for 30 days   . spironolactone (ALDACTONE) 25 mg Oral Tablet Take 1 Tablet (25 mg total) by mouth Once a day for 90 days     Family History:  Family Medical History:     Problem Relation (Age of Onset)    Atrial fibrillation Sister    Congestive Heart Failure Mother    Diabetes type II Mother    Emphysema Mother    Heart Attack Father    Heart Disease Mother, Father, Sister    Hypertension (High Blood Pressure) Mother, Father    No Known Problems Brother, Half-Sister, Half-Brother, Maternal Aunt, Maternal Uncle, Paternal Aunt, Paternal Uncle, Maternal Grandmother, Maternal Grandfather, Paternal Grandmother, Paternal Grandfather, Daughter, Son    Pacemaker Sister          Social History:  Social History     Socioeconomic History   . Marital status: Married     Spouse name: [name]   . Number of children: 3   Occupational History     Comment: [employer]; housekeeping.   Tobacco Use   . Smoking status: Never   . Smokeless tobacco: Never   Vaping Use   . Vaping Use: Never used   Substance and Sexual Activity   . Alcohol use: Never   . Drug use: Never   . Sexual activity: Never     Partners: Male           Review of Systems:  Any pertinent Review of Systems as addressed in the HPI above.    Physical  Exam:  Vital Signs:  Vitals:    01/02/22 0834   BP: 126/84   Pulse: 54   Resp: 19   Temp: 36.2 C (97.2 F)   SpO2: 99%   Weight: 91.3 kg (201 lb 4 oz)   Height: 1.676 m (5\' 6" )   BMI: 32.55     Physical Exam  Constitutional:       Appearance: She is obese.   HENT:      Head: Normocephalic.      Right Ear: Tympanic membrane normal.      Left Ear: Tympanic membrane normal.      Nose: Nose normal.      Mouth/Throat:      Mouth: Mucous membranes are dry.      Pharynx: Oropharynx is clear.   Eyes:      Extraocular Movements: Extraocular movements intact.      Conjunctiva/sclera: Conjunctivae normal.   Cardiovascular:      Rate and Rhythm: Regular rhythm. Bradycardia present.      Pulses: Normal pulses.      Heart sounds: Normal heart sounds, S1 normal and S2 normal.   Pulmonary:      Effort: Pulmonary effort is normal.      Breath sounds: Normal breath sounds.   Abdominal:      General: Bowel sounds are normal.      Palpations: Abdomen is soft.   Musculoskeletal:         General: Normal range of motion.      Cervical back: Neck supple.      Right lower leg: No edema.      Left lower leg: No edema.   Skin:     General: Skin is warm and dry.   Neurological:      General: No focal deficit present.      Mental Status: She is alert and oriented to person, place, and time.      Sensory: Sensation is intact.      Motor: Motor function is intact.      Coordination: Coordination is intact.      Gait: Gait is intact.   Psychiatric:  Mood and Affect: Mood normal.         Behavior: Behavior normal. Behavior is cooperative.         Thought Content: Thought content normal.         Cognition and Memory: Cognition normal.         Judgment: Judgment normal.          Assessment/Plan:  (I10) Essential hypertension  (primary encounter diagnosis)  Plan: EKG - Perfrmed at [facility], 3 DAY         EXTENDED HOLTER MONITOR, CBC, COMPREHENSIVE         METABOLIC PNL, FASTING, LIPID PANEL, MAGNESIUM,        THYROID STIMULATING  HORMONE (SENSITIVE TSH),         VITAMIN D 25 TOTAL, VITAMIN B12    (E78.2) Mixed hyperlipidemia  Plan: CBC, COMPREHENSIVE METABOLIC PNL, FASTING,         LIPID PANEL, MAGNESIUM, THYROID STIMULATING         HORMONE (SENSITIVE TSH), VITAMIN D 25 TOTAL,         VITAMIN B12    (G47.30) Sleep apnea, unspecified type  Plan: CBC, COMPREHENSIVE METABOLIC PNL, FASTING,         LIPID PANEL, MAGNESIUM, THYROID STIMULATING         HORMONE (SENSITIVE TSH), VITAMIN D 25 TOTAL,         VITAMIN B12    (F41.9) Anxiety  Plan: CBC, COMPREHENSIVE METABOLIC PNL, FASTING,         LIPID PANEL, MAGNESIUM, THYROID STIMULATING         HORMONE (SENSITIVE TSH), VITAMIN D 25 TOTAL,         VITAMIN B12    (E66.9) Obesity (BMI 30.0-34.9)  Plan: CBC, COMPREHENSIVE METABOLIC PNL, FASTING,         LIPID PANEL, MAGNESIUM, THYROID STIMULATING         HORMONE (SENSITIVE TSH), VITAMIN D 25 TOTAL,         VITAMIN B12    (E55.9) Vitamin D deficiency  Plan: CBC, COMPREHENSIVE METABOLIC PNL, FASTING,         LIPID PANEL, MAGNESIUM, THYROID STIMULATING         HORMONE (SENSITIVE TSH), VITAMIN D 25 TOTAL,         VITAMIN B12    (E53.8) B12 deficiency  Plan: CBC, COMPREHENSIVE METABOLIC PNL, FASTING,         LIPID PANEL, MAGNESIUM, THYROID STIMULATING         HORMONE (SENSITIVE TSH), VITAMIN D 25 TOTAL,         VITAMIN B12    (R00.1) Bradycardia  Plan: EKG - Perfrmed at [facility], 3 DAY         EXTENDED HOLTER MONITOR, CBC, COMPREHENSIVE         METABOLIC PNL, FASTING, LIPID PANEL, MAGNESIUM,        THYROID STIMULATING HORMONE (SENSITIVE TSH),         VITAMIN D 25 TOTAL, VITAMIN B12    (R42) Dizziness  Plan: EKG - Perfrmed at [facility], 3 DAY         EXTENDED HOLTER MONITOR, CBC, COMPREHENSIVE         METABOLIC PNL, FASTING, LIPID PANEL, MAGNESIUM,        THYROID STIMULATING HORMONE (SENSITIVE TSH),         VITAMIN D 25 TOTAL, VITAMIN B12    (R00.2) Palpitations  Plan: EKG - Perfrmed at [facility], 3 DAY  EXTENDED HOLTER  MONITOR, CBC, COMPREHENSIVE         METABOLIC PNL, FASTING, LIPID PANEL, MAGNESIUM,        THYROID STIMULATING HORMONE (SENSITIVE TSH),         VITAMIN D 25 TOTAL, VITAMIN B12    (J30.9) Allergic rhinitis  Continue Zyrtec and Flonase daily.  Hydrate well.  Dexamethsone 10 mg IM given today.      Labs orders given to patient today. Stop HCTZ for HTN.  Return to clinic in 2 weeks to recheck BP.  Advised to follow up with with Cardiologist as scheduled. Continue current medications.  Advised a low-fat/low sodium diet, advised 150 minutes of scheduled weekly physical activity as tolerated.  Advised to maintain a healthy weight.    Return in about 3 months (around 04/04/2022) for 3 month routine visit; schedule 2 week BP recheck.Stormy Fabian, NP-C     Portions of this note may be dictated using voice recognition software or a dictation service. Variances in spelling and vocabulary are possible and unintentional. Not all errors are caught/corrected. Please notify the [facility] if any discrepancies are noted or if the meaning of any statement is not clear.

## 2022-01-06 ENCOUNTER — Other Ambulatory Visit: Payer: Self-pay

## 2022-01-06 ENCOUNTER — Inpatient Hospital Stay
Admission: RE | Admit: 2022-01-06 | Discharge: 2022-01-06 | Disposition: A | Discharge status: Home / Self Care | Payer: No Typology Code available for payment source | Source: Ambulatory Visit | Attending: Family | Admitting: Family

## 2022-01-06 DIAGNOSIS — I10 Essential (primary) hypertension: Secondary | ICD-10-CM | POA: Insufficient documentation

## 2022-01-06 DIAGNOSIS — R002 Palpitations: Secondary | ICD-10-CM | POA: Insufficient documentation

## 2022-01-06 DIAGNOSIS — R001 Bradycardia, unspecified: Secondary | ICD-10-CM | POA: Insufficient documentation

## 2022-01-06 DIAGNOSIS — R42 Dizziness and giddiness: Secondary | ICD-10-CM | POA: Insufficient documentation

## 2022-01-08 LAB — ECG 12 LEAD
Atrial Rate: 48 {beats}/min
Calculated P Axis: 57 degrees
Calculated R Axis: -34 degrees
Calculated T Axis: -10 degrees
PR Interval: 170 ms
QRS Duration: 84 ms
QT Interval: 450 ms
QTC Calculation: 402 ms
Ventricular rate: 48 {beats}/min

## 2022-01-10 ENCOUNTER — Other Ambulatory Visit (RURAL_HEALTH_CENTER): Payer: Self-pay | Admitting: Family

## 2022-01-10 DIAGNOSIS — R001 Bradycardia, unspecified: Secondary | ICD-10-CM

## 2022-01-10 NOTE — Addendum Note (Signed)
Addended by: Lamont Dowdy on: 01/10/2022 08:33 AM     Modules accepted: Orders

## 2022-01-10 NOTE — Result Encounter Note (Signed)
Call patient with EKG report.  Patient completed a 3 day Holter monitor and turned Holter monitor yesterday.  We will fax Holter monitor report to cardiologist, Dr. Nicholos Johns at [facility] once received.  Advised patient to stop spironolactone until evaluated by Cardiology.  Advised patient to make sure she is staying well hydrated.  Advise patient to be very careful with quick movements and realized that she may be fatigued more than usual with low heart rate.  She has a high fall risk increased risk of syncope.  Advised patient she has chest pain or shortness a breath to go to emergency room for further evaluation.  Patient verbalized understanding and agreed with plan of care.  Patient reports a history of bradycardia.  Patient is an established patient of Cardiology.Kelsey Pitts

## 2022-01-16 ENCOUNTER — Ambulatory Visit (RURAL_HEALTH_CENTER): Payer: No Typology Code available for payment source | Attending: Family

## 2022-01-16 ENCOUNTER — Other Ambulatory Visit: Payer: Self-pay

## 2022-01-16 VITALS — BP 130/80 | HR 58

## 2022-01-16 DIAGNOSIS — I10 Essential (primary) hypertension: Secondary | ICD-10-CM | POA: Insufficient documentation

## 2022-01-16 DIAGNOSIS — Z538 Procedure and treatment not carried out for other reasons: Secondary | ICD-10-CM | POA: Insufficient documentation

## 2022-01-16 LAB — 3 DAY EXTENDED HOLTER MONITOR
Isolated VE Counts: 42 episodes
Longest supraventricular tachycardia episode - duration: 11.1 s
Longest supraventricular tachycardia episode - number of be: 22 beats
SVE Triplets Counts: 2 episodes
Supraventricular tachycardia - heart rate (average): 126 {beats}/min
Supraventricular tachycardia - number of episodes: 5
Supraventricular tachycardia with fastest heart rate - dura: 2.2 s
VE Couplets Counts: 4 episodes

## 2022-01-17 LAB — 3 DAY EXTENDED HOLTER MONITOR
Heart rate (average): 66 {beats}/min
Isolated SVE count: 144 episodes
Longest supraventricular tachycardia episode - heart rate (: 129 {beats}/min
SVE Couplets Counts: 18 episodes
Supraventricular tachycardia with fastest heart rate - hear: 155 {beats}/min
Supraventricular tachycardia with fastest heart rate - numb: 5 beats

## 2022-01-22 ENCOUNTER — Ambulatory Visit (RURAL_HEALTH_CENTER): Payer: Self-pay | Admitting: Family

## 2022-01-23 ENCOUNTER — Other Ambulatory Visit: Payer: Self-pay

## 2022-01-23 ENCOUNTER — Ambulatory Visit (RURAL_HEALTH_CENTER): Payer: No Typology Code available for payment source | Attending: Family | Admitting: Family

## 2022-01-23 ENCOUNTER — Encounter (RURAL_HEALTH_CENTER): Payer: Self-pay | Admitting: Family

## 2022-01-23 VITALS — BP 140/73 | HR 54 | Temp 96.6°F | Resp 17 | Ht 66.0 in | Wt 204.0 lb

## 2022-01-23 DIAGNOSIS — M25571 Pain in right ankle and joints of right foot: Secondary | ICD-10-CM | POA: Insufficient documentation

## 2022-01-23 NOTE — Progress Notes (Signed)
[facility] MEDICINE [facility]  [facility] FAMILY MEDICINE      KRISTEENA MEINEKE  MRN: [id number]  DOB: 08-27-1955  Date of Service: 01/23/2022    CHIEF COMPLAINT  Chief Complaint   Patient presents with    Ankle Pain     Right ankle pain x4-6 months, getting worse.       SUBJECTIVE  Kelsey Pitts is a 66 y.o. female who presents to clinic for right ankle pain x 3 years, worse in the past 6 months.  Reports she had a fall x 3 years ago and injured her right ankle.  Reports ankle has never completely healed.  Reports the past 6 months right ankle pain has increased in burning, swelling and painful to touch for 6 months.  Patient reports she is having more pain with walking.  Reports elevating and rest does help symptoms.  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  Denies recent fall or injury in the past year.  .     Review of Systems:  Positive ROS discussed in HPI, otherwise all other systems negative.      Medications:   Calcium Carbonate 500 mg calcium (1,250 mg) Oral Tablet, Chewable, every day  cetirizine (ZYRTEC) 10 mg Oral Tablet, Take 1 Tablet (10 mg total) by mouth Once a day for 90 days  cyanocobalamin (VITAMIN B12) 1,000 mcg/mL Injection Solution, Inject 1 mL (1,000 mcg total) into the muscle  ergocalciferol, vitamin D2, (DRISDOL) 1,250 mcg (50,000 unit) Oral Capsule, Take 1 Capsule (50,000 Units total) by mouth Every 7 days for 90 days  fluticasone propionate (FLONASE) 50 mcg/actuation Nasal Spray, Suspension, Administer 1 Spray into each nostril Once a day for 30 days  cholecalciferol, Vitamin D3, 125 mcg (5,000 unit) Oral Tablet, Take 1 Tablet (5,000 Units total) by mouth Once a day for 90 days (Patient not taking: Reported on 01/23/2022)  lisinopriL (PRINIVIL) 5 mg Oral Tablet, Take 1 Tablet (5 mg total) by mouth Once a day for 90 days (Patient not taking: Reported on 01/02/2022)  spironolactone (ALDACTONE) 25 mg Oral Tablet, Take 1 Tablet (25 mg total) by mouth Once a  day for 90 days (Patient not taking: Reported on 01/23/2022)    No facility-administered medications prior to visit.      Allergies:   No Known Allergies      OBJECTIVE  BP (!) 140/73   Pulse 54   Temp (!) 35.9 C (96.6 F)   Resp 17   Ht 1.676 m (5\' 6" )   Wt 92.5 kg (204 lb)   LMP  (LMP Unknown)   SpO2 97%   BMI 32.93 kg/m       Physical Exam  Constitutional:       Appearance: She is obese.   Cardiovascular:      Rate and Rhythm: Normal rate and regular rhythm.      Pulses:           Dorsalis pedis pulses are 2+ on the right side and 2+ on the left side.   Pulmonary:      Effort: Pulmonary effort is normal.      Breath sounds: Normal breath sounds.   Musculoskeletal:      Right lower leg: No edema.      Left lower leg: No edema.      Right ankle: Swelling present. No deformity, ecchymosis or lacerations. Tenderness present. Normal range of motion.      Right Achilles Tendon:  Normal.      Left ankle: Normal.      Left Achilles Tendon: Normal.      Right foot: Normal.      Left foot: Normal.      Comments: Tenderness to medial right ankle, slight swelling noted.  Tender with palpation.  No lesions.   Full ROM ntoed.   Neurological:      Mental Status: She is alert.      Motor: Motor function is intact.      Coordination: Coordination is intact.      Gait: Gait abnormal.   Psychiatric:         Behavior: Behavior is cooperative.         Cognition and Memory: Cognition normal.       ASSESSMENT/PLAN  (M25.571) Right ankle pain  (primary encounter diagnosis)  Plan: XR ANKLE RIGHT     X-ray ordered given to patient.  Further treatment pending x-ray.  Advised conservative measures such as over-the-counter Tylenol/Motrin, rest, ice for 20 minutes twice a day and elevate when sitting.  Advised follow-up if symptoms progress or do not resolve.      Return if symptoms worsen or fail to improve.    Stormy Fabian, NP-C 01/23/2022, 08:49

## 2022-01-25 ENCOUNTER — Other Ambulatory Visit (HOSPITAL_COMMUNITY): Payer: No Typology Code available for payment source

## 2022-01-25 ENCOUNTER — Other Ambulatory Visit: Payer: Self-pay

## 2022-01-25 ENCOUNTER — Inpatient Hospital Stay
Admission: RE | Admit: 2022-01-25 | Discharge: 2022-01-25 | Disposition: A | Discharge status: Home / Self Care | Payer: No Typology Code available for payment source | Source: Ambulatory Visit | Attending: Family | Admitting: Family

## 2022-01-25 DIAGNOSIS — R002 Palpitations: Secondary | ICD-10-CM | POA: Insufficient documentation

## 2022-01-25 DIAGNOSIS — I10 Essential (primary) hypertension: Secondary | ICD-10-CM

## 2022-01-25 DIAGNOSIS — G4730 Sleep apnea, unspecified: Secondary | ICD-10-CM

## 2022-01-25 DIAGNOSIS — E669 Obesity, unspecified: Secondary | ICD-10-CM

## 2022-01-25 DIAGNOSIS — E538 Deficiency of other specified B group vitamins: Secondary | ICD-10-CM | POA: Insufficient documentation

## 2022-01-25 DIAGNOSIS — R42 Dizziness and giddiness: Secondary | ICD-10-CM | POA: Insufficient documentation

## 2022-01-25 DIAGNOSIS — F419 Anxiety disorder, unspecified: Secondary | ICD-10-CM | POA: Insufficient documentation

## 2022-01-25 DIAGNOSIS — E559 Vitamin D deficiency, unspecified: Secondary | ICD-10-CM | POA: Insufficient documentation

## 2022-01-25 DIAGNOSIS — R001 Bradycardia, unspecified: Secondary | ICD-10-CM | POA: Insufficient documentation

## 2022-01-25 DIAGNOSIS — E782 Mixed hyperlipidemia: Secondary | ICD-10-CM | POA: Insufficient documentation

## 2022-01-25 DIAGNOSIS — M25571 Pain in right ankle and joints of right foot: Secondary | ICD-10-CM | POA: Insufficient documentation

## 2022-01-25 LAB — COMPREHENSIVE METABOLIC PNL, FASTING
ALBUMIN/GLOBULIN RATIO: 1.8 — ABNORMAL HIGH (ref 0.8–1.4)
ALBUMIN: 4.2 g/dL (ref 3.5–5.7)
ALKALINE PHOSPHATASE: 49 U/L (ref 34–104)
ALT (SGPT): 11 U/L (ref 7–52)
ANION GAP: 5 mmol/L — ABNORMAL LOW (ref 10–20)
AST (SGOT): 12 U/L — ABNORMAL LOW (ref 13–39)
BILIRUBIN TOTAL: 0.6 mg/dL (ref 0.3–1.2)
BUN/CREA RATIO: 25 — ABNORMAL HIGH (ref 6–22)
BUN: 17 mg/dL (ref 7–25)
CALCIUM, CORRECTED: 9.3 mg/dL (ref 8.9–10.8)
CALCIUM: 9.5 mg/dL (ref 8.6–10.3)
CHLORIDE: 107 mmol/L (ref 98–107)
CO2 TOTAL: 29 mmol/L (ref 21–31)
CREATININE: 0.69 mg/dL (ref 0.60–1.30)
ESTIMATED GFR: 96 mL/min/{1.73_m2} (ref >59)
GLOBULIN: 2.3 — ABNORMAL LOW (ref 2.9–5.4)
GLUCOSE: 116 mg/dL — ABNORMAL HIGH (ref 74–109)
OSMOLALITY, CALCULATED: 284 mOsm/kg (ref 270–290)
POTASSIUM: 3.7 mmol/L (ref 3.5–5.1)
PROTEIN TOTAL: 6.5 g/dL (ref 6.4–8.9)
SODIUM: 141 mmol/L (ref 136–145)

## 2022-01-25 LAB — CBC
HCT: 39.4 % (ref 37.0–47.0)
HGB: 13.2 g/dL (ref 12.5–16.0)
MCH: 29.6 pg (ref 27.0–32.0)
MCHC: 33.5 g/dL (ref 32.0–36.0)
MCV: 88.3 fL (ref 78.0–99.0)
MPV: 8 fL (ref 7.4–10.4)
PLATELETS: 234 10*3/uL (ref 140–440)
RBC: 4.46 10*6/uL (ref 4.20–5.40)
RDW: 12.8 % (ref 11.6–14.8)
WBC: 5 10*3/uL (ref 4.0–10.5)
WBCS UNCORRECTED: 5 10*3/uL

## 2022-01-25 LAB — LIPID PANEL
CHOL/HDL RATIO: 2.9
CHOLESTEROL: 212 mg/dL — ABNORMAL HIGH (ref <200)
HDL CHOL: 73 mg/dL (ref 23–92)
LDL CALC: 129 mg/dL — ABNORMAL HIGH (ref 0–100)
TRIGLYCERIDES: 51 mg/dL (ref <=150)
VLDL CALC: 10 mg/dL (ref 0–50)

## 2022-01-25 LAB — VITAMIN B12: VITAMIN B 12: 540 pg/mL (ref 180–914)

## 2022-01-25 LAB — MAGNESIUM: MAGNESIUM: 2.3 mg/dL (ref 1.9–2.7)

## 2022-01-25 LAB — THYROID STIMULATING HORMONE (SENSITIVE TSH): TSH: 1.63 u[IU]/mL (ref 0.450–5.330)

## 2022-01-25 LAB — VITAMIN D 25 TOTAL: VITAMIN D: 32 ng/mL (ref 30–100)

## 2022-01-26 ENCOUNTER — Other Ambulatory Visit (RURAL_HEALTH_CENTER): Payer: Self-pay | Admitting: Family

## 2022-01-26 DIAGNOSIS — G8929 Other chronic pain: Secondary | ICD-10-CM

## 2022-02-07 ENCOUNTER — Telehealth (RURAL_HEALTH_CENTER): Payer: Self-pay | Admitting: Family

## 2022-03-14 ENCOUNTER — Other Ambulatory Visit: Payer: Self-pay

## 2022-04-02 ENCOUNTER — Ambulatory Visit (HOSPITAL_COMMUNITY)
Admission: RE | Admit: 2022-04-02 | Discharge: 2022-04-02 | Disposition: A | Discharge status: Still In Hospital | Payer: No Typology Code available for payment source | Source: Ambulatory Visit

## 2022-04-02 ENCOUNTER — Other Ambulatory Visit: Payer: Self-pay

## 2022-04-02 DIAGNOSIS — M779 Enthesopathy, unspecified: Secondary | ICD-10-CM | POA: Insufficient documentation

## 2022-04-02 DIAGNOSIS — M25571 Pain in right ankle and joints of right foot: Secondary | ICD-10-CM | POA: Insufficient documentation

## 2022-04-02 DIAGNOSIS — M7989 Other specified soft tissue disorders: Secondary | ICD-10-CM | POA: Insufficient documentation

## 2022-04-02 NOTE — PT Evaluation (Signed)
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 Since then she has had ongoing pain in the right medial ankle that had progressively increased over the last 6 months. She reports some swelling in the ankle as well.     Previous episodes/treatments: No prior PT sessions    Medications for this problem:  None for the pain     Diagnostic tests:   XR ANKLE RIGHT performed on 01/25/2022 4:08 PM  CLINICAL HISTORY: M25.571: Right ankle pain.  rt ankle pain  TECHNIQUE:  3 views of the right ankle  COMPARISON:  04/09/2021  FINDINGS:   No visible fracture.  No suspicious bone lesion.  Normal alignment.  Mortise appears intact.  No effusion.  Soft tissues are unremarkable.  There is a small calcaneal bone spur as well as enthesophyte at the Achilles attachment.  IMPRESSION:  NO ACUTE FINDINGS.     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     Weakness: Does report that her right knee feels weak compared to left. 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   Problem Score   1. Standing for most of  8 hour work shifts 7     Completed the Foot and Ankle Disability Index with score: 69% disability           OBJECTIVE    AROM   right left   Ankle DF 15 20   Ankle PF 50 55   Ankle Inversion 30 40   Ankle Eversion 15 20     ROM comments Pain noted with ankle inversion actively, not as much pain     Strength     right left   Hip flexion  4- 4-   Hip extension  NT NT   Hip abduction 4- SEATED 4- SEATED   Hip adduction 4 4   Knee flexion  4 4   Knee extension  4- 4   Ankle DF  4- 4   Ankle PF  5 heel raises partial range 15 heel raises   Ankle Inversion 3- 3   Ankle Eversion 3- 3     Strength comments: Pain with resisted ankle inversion, minimal to no pain noted with all other resisted strengthening.     Single Leg Stance: Left 25 seconds and good stability noted. Right 3-4 seconds with significant postural sway and increased pain.     Gait: NO ASSISTIVE DEVICE, does ambulate with decreased heel strike, narrowed base of support, increased pronation of feet more noted on right than left, right knee valgus. Assessed gait with adding sole inserts which minimize pronation and patient more supported.     Palpation: Noted moderate tenderness along the medial aspect of the right  ankle just distal to the medial malleoli and along the tibialis posterior tendon.     Joint mobility: [facility]     Posture:GENU VALGUS      Treatment provided:REVIEW OF POC AND GOALS WITH PATIENT, ALL QUESTIONS ANSWERED, PATIENT EDUCATION, and THERAPEUTIC EXERCISE     Access Code: [email or web address]  URL: [email or web address]  Date: 04/02/2022  Prepared by: Parks Neptune    Exercises  - Long Sitting Isometric Ankle Eversion in Dorsiflexion with Ball at Wall  - 1 x daily - 7 x weekly - 3 sets - 10 reps  - Isometric Ankle Inversion  - 1 x daily - 7 x weekly - 3 sets - 10 reps          ASSESSMENT    Impression: Kelsey Pitts is a 66 year old female who presents with chronic right medial ankle pain that increases more with weight bearing activities during the day. She states that after working her 8 hour shift that pain is pretty unbearable. As noted in the evaluation, she does present with some instability of the right ankle compared to left with the single leg stance, MMT, and noted increased pain with heel raises. She also presents with poor gait mechanics including increased pronation of the right ankle along with right knee valgus stress. She tolerated initial HEP with good tolerance and will benefit from skilled PT services to address ongoing functional limitations to reduce pain and improve quality of life.     Rehab potential: GOOD    Short Term Goals: 3 Weeks   -Strength of right ankle stability to 4/5 or greater to demonstrate improvement in stability with gait.    -Intermittent vs. constant pain. Worst SPS rating less than [6/10].   -Patient will be independent in HEP and demonstrate compliance to progress with strengthening.    -Patient will report ability to stand and walk for 1-2 hours without increase in pain.    -Patient  will be able to improve right heel raise test to complete 10 reps with full range.       Long Term Goals: 6 Weeks   -Resume community ambulation without increase in right ankle pain.    -Sleep not  disrupted by LE pain. Worst SPS rating less than 3/10.    -Improve Foot and Ankle Disability Index score from 69% disability to 30% disability or less.    -Patient will demonstrate improved right ankle stability to tolerate x15 seconds or greater right SLS with no increase in pain.         PLAN  Patient will attend 2 times per week x 6 weeks. Therapy may include, but is not limited to THERAPEUTIC EXERCISES, MYOFASCIAL/JOINT MOBILIZATION, POSTURE/BODY MECHANICS, ERGONOMIC TRAINING, TRANSFER/GAIT TRAINING, HOME INSTRUCTIONS, HEAT/COLD, ULTRASOUND, ELECTRICAL STIMULATION, KINESIOTAPE, and NEURO RE-EDUCATIOIN    Plan for next visit May look at taping of the right foot to reduce pronation, continue to progress with ankle strengthening in open chain progressing to closed chain as tolerated.        Evaluation complexity:   Personal factors impacting POC: OCCUPATIONAL ADLS (IE HEAVY LIFTING, REPETITIVE TASKS, LONG HOURS)   Co-morbidities impacting POC:  None known   Complexity of physical exam: INCLUDING MUSCULOSKELETAL SYSTEM (POSTURE, ROM, STRENGTH, HEIGHT/WEIGHT) and INCLUDING NEUROMUSCULAR EXAM (BALANCE, GAIT, LOCOMOTION, MOBILITY)   Clinical Presentation: STABLE   Evaluation Complexity: LOW-HISTORY 0, EXAMINATION 1-2, STABLE PRESENTATION      Total Session Time 45, Timed code minutes 15, and Untimed code minutes 30         Intervention minutes: EVALUATION 30 minutes and THERAPEUTIC EXERCISE 15 minutes    William Hamburger, PT  04/02/2022, 08:04      Start of Service: _________          Certification:    From:______  Through:_________    I certify the need for these services furnished under this plan of treatment and while under my care.    Referring Provider Signature: _______________     Date : _____________________

## 2022-04-07 ENCOUNTER — Ambulatory Visit (HOSPITAL_COMMUNITY)
Admission: RE | Admit: 2022-04-07 | Discharge: 2022-04-07 | Disposition: A | Discharge status: Still In Hospital | Payer: No Typology Code available for payment source | Source: Ambulatory Visit

## 2022-04-07 ENCOUNTER — Other Ambulatory Visit: Payer: Self-pay

## 2022-04-07 NOTE — PT Treatment (Signed)
[facility]  Outpatient Physical Therapy  [place], [address]  [phone]  [phone]    Physical Therapy Treatment Note    Date: 04/07/2022  Patient's Name: Kelsey Pitts  Date of Birth: 1956/01/18            Visit #/POC: 2 of 12  Authorization: 2 of 12  POC Signed?: Yes  POC Ends: 05/14/22  Next Progress Note Due: 8-10 visits      Evaluating Physical Therapist: Jolene Schimke, DPT  PT diagnosis/Reason for Referral: R ankle pain-tendonitis  Next Scheduled Physician Appointment: TBD  Allergies/Contraindications: none          Subjective: Reports that when she stands on her feet for a long time she has a "squishy knot" that pops up on her medial ankle and it burns.     Objective: Warm up on Nustep followed by there ex per flow sheet for ankle mobility and strengthening. Also trial of kinesiotape to correct pronation. 1 I strip and a stirrup around calcaneus (time included with there ex) Issued pt kinesiotaping care and removal handout.    Measured ROM:   EXERCISE/ACTIVITY NAME REPETITIONS RESISTANCE COMPLETED THIS DOS   Nustep   6 minutes L2 Yes   4 way ankle isotonics   10 each red yes   Calcaneal rock   - - Yes   Tib/fib ant/post gliding   - - Yes   Passive stretching   - - Yes                           Kinesiotaping   - - Yes         Assessment: Isotonic strengthening and ankle joint mobilizations were tolerated well with no complaints.   Short Term Goals: 3 Weeks               -Strength of right ankle stability to 4/5 or greater to demonstrate improvement in stability with gait.                -Intermittent vs. constant pain. Worst SPS rating less than [6/10].               -Patient will be independent in HEP and demonstrate compliance to progress with strengthening.                -Patient will report ability to stand and walk for 1-2 hours without increase in pain.                -Patient will be able to improve right heel raise test to complete 10  reps with full range.       Long Term Goals: 6 Weeks               -Resume community ambulation without increase in right ankle pain.                -Sleep not disrupted by LE pain. Worst SPS rating less than 3/10.                -Improve Foot and Ankle Disability Index score from 69% disability to 30% disability or less.                -Patient will demonstrate improved right ankle stability to tolerate x15 seconds or greater right SLS with no increase in pain.   Plan: Continue with ROM and strengthening to R ankle,  assess effectiveness of kinesiotaping.    Total Session Time 39 and Timed code minutes 39  THERAPEUTIC EXERCISE 39 minutes      Bingham Millette, PTA  04/07/2022, 10:19

## 2022-04-09 ENCOUNTER — Ambulatory Visit (HOSPITAL_COMMUNITY): Payer: Self-pay

## 2022-04-09 ENCOUNTER — Encounter (HOSPITAL_COMMUNITY): Payer: Self-pay

## 2022-04-15 ENCOUNTER — Ambulatory Visit (HOSPITAL_COMMUNITY): Payer: Self-pay

## 2022-04-17 ENCOUNTER — Ambulatory Visit (HOSPITAL_COMMUNITY): Payer: Self-pay

## 2022-04-23 ENCOUNTER — Ambulatory Visit (HOSPITAL_COMMUNITY)
Admission: RE | Admit: 2022-04-23 | Discharge: 2022-04-23 | Disposition: A | Discharge status: Still In Hospital | Payer: No Typology Code available for payment source | Source: Ambulatory Visit

## 2022-04-23 ENCOUNTER — Other Ambulatory Visit: Payer: Self-pay

## 2022-04-23 NOTE — PT Treatment (Signed)
[facility]  Outpatient Physical Therapy  [place], [address]  [phone]  [phone]    Physical Therapy Treatment Note    Date: 04/23/2022  Patient's Name: Kelsey Pitts  Date of Birth: 05/01/1956    Visit #/POC: 3 of 12  Authorization: 3 of 12  POC Signed?: Yes  POC Ends: 05/14/22  Next Progress Note Due: 8-10 visits     Evaluating Physical Therapist: Jolene Schimke, DPT  PT diagnosis/Reason for Referral: R ankle pain-tendonitis  Next Scheduled Physician Appointment: TBD  Allergies/Contraindications: none       Subjective: Continue to report the squishy knot on the medial right ankle that burns. She has missed the last few sessions due to [place]. She is also reporting significant pain throughout the right ankle after work. Reports that KT tape was too sore due to location of the tape.      Objective:      Measured ROM: Not formally assessed with goni, however does present with pain with passive ankle inversion and active eversion with limitations.   Ankle DF/PF no pain actively or passively  Ankle Inversion slight discomfort with passive, however not as much as eversion.   EXERCISE/ACTIVITY NAME REPETITIONS RESISTANCE COMPLETED THIS DOS   Nustep    6 minutes L2 N   4 way ankle isotonics    10 each Yellow  yes   Calcaneal rock    - - Yes   Tib/fib ant/post gliding    - - Yes   Passive stretching    - - Yes    Ultrasound to the left medial ankle      8 minutes   Warming off  Continuous   3.3 Mhz  1.0 to 0.5 W/cm2  Y                             Kinesiotaping    - - Yes         Assessment: Patient continues to have increased limitations with left ankle inversion with passive and active mobility with increased pain. She also presents with edema like pocket of the anterior aspect of the medial malleoli that is sensitive to palpate. Therapist did apply ultrasound to this region with discomfort noted with 1.0 W/cm2, however when decreasing to 0.5 W/cm2 was  tolerable. Focused of this treatment was to break up and reduce any potential scar tissue. After treatment, no change in symptoms. Therapist did retape left ankle to facilitate proper ankle alignment due to tendency to go into pronation in weight bearing stance, this time placing the tape anterior to the medial malleoli. Continued to educate on importance of good footwear for work as she is on her feet for nearly the entire shift at work.        Short Term Goals: 3 Weeks               -Strength of right ankle stability to 4/5 or greater to demonstrate improvement in stability with gait.                -Intermittent vs. constant pain. Worst SPS rating less than [6/10].               -Patient will be independent in HEP and demonstrate compliance to progress with strengthening.                -Patient will report ability to stand  and walk for 1-2 hours without increase in pain.                -Patient will be able to improve right heel raise test to complete 10 reps with full range.       Long Term Goals: 6 Weeks               -Resume community ambulation without increase in right ankle pain.                -Sleep not disrupted by LE pain. Worst SPS rating less than 3/10.                -Improve Foot and Ankle Disability Index score from 69% disability to 30% disability or less.                -Patient will demonstrate improved right ankle stability to tolerate x15 seconds or greater right SLS with no increase in pain.     Plan: Will further assess left ankle mobility with focus on subtalor (talocalcaneal) mobility due to limitations in eversion as well as pain along the deltoid ligament region.      Total Session Time 45 and Timed code minutes 45  THERAPEUTIC EXERCISE 20 minutes,  ULTRASOUND , and JOINT MOBILIZATION/MFR 17 minutes      [employer], PT  04/23/2022, 11:43

## 2022-04-25 ENCOUNTER — Ambulatory Visit (HOSPITAL_COMMUNITY): Payer: Self-pay

## 2022-04-28 ENCOUNTER — Ambulatory Visit (HOSPITAL_COMMUNITY): Payer: Self-pay

## 2022-04-30 ENCOUNTER — Other Ambulatory Visit: Payer: Self-pay

## 2022-04-30 ENCOUNTER — Ambulatory Visit
Admission: RE | Admit: 2022-04-30 | Discharge: 2022-04-30 | Disposition: A | Discharge status: Still In Hospital | Payer: No Typology Code available for payment source | Source: Ambulatory Visit | Attending: Orthopaedic Surgery | Admitting: Orthopaedic Surgery

## 2022-04-30 NOTE — PT Treatment (Addendum)
[facility] Medicine [facility]  Outpatient Physical Therapy  [address]  [place], [address]  [phone]  (Fax) [phone]    Physical Therapy Treatment/Discharge Note    Date: 04/30/2022  Patient's Name: Kelsey Pitts  Date of Birth: Sep 16, 1955    Visit #/POC: 4 of 12  Authorization: 4 of 12  POC Signed?: Yes  POC Ends: 05/14/22  Next Progress Note Due: 8-10 visits    Pt to be discharged due to multiple cxs and no shows per attendance policy.     Evaluating Physical Therapist: Rushie Goltz PT, DPT will be assuming care for pt from Louretta Parma, DPT as of 04/28/22.  PT diagnosis/Reason for Referral: R ankle pain-tendonitis  Next Scheduled Physician Appointment: TBD  Allergies/Contraindications: none       Subjective: States that her ankle pain is 2/10 however she has not been on her foot very much yet today but notes that after she goes to work it will be a 10 or more.      Objective: R ankle joint mobilization per flow sheet below to address eversion deficits     Measured ROM: Not formally assessed with goni, however does present with pain with passive ankle inversion and active eversion with limitations.   Ankle DF/PF no pain actively or passively  Ankle Inversion slight discomfort with passive, however not as much as eversion.   EXERCISE/ACTIVITY NAME REPETITIONS RESISTANCE COMPLETED THIS DOS   Nustep    6 minutes L2 N   4 way ankle isotonics    10 each Yellow  N   Calcaneal rock    - - Yes   Tib/fib ant/post gliding    - - Yes   Passive stretching    - - Yes    Ultrasound to the left medial ankle      8 minutes   Warming off  Continuous   3.3 Mhz  1.0 to 0.5 W/cm2  N    subtalar glides-medial         Yes                Kinesiotaping    - - N         Assessment: Continues with reports of high pain levels when she has been on her feet for long periods. Had some tenderness with mobilizations but overall tolerated well.    Goals not re-assessed. Check last progress note for most  updated measurements.  Short Term Goals: 3 Weeks               -Strength of right ankle stability to 4/5 or greater to demonstrate improvement in stability with gait.                -Intermittent vs. constant pain. Worst SPS rating less than [6/10].               -Patient will be independent in HEP and demonstrate compliance to progress with strengthening.                -Patient will report ability to stand and walk for 1-2 hours without increase in pain.                -Patient will be able to improve right heel raise test to complete 10 reps with full range.       Long Term Goals: 6 Weeks               -Resume community ambulation without  increase in right ankle pain.                -Sleep not disrupted by LE pain. Worst SPS rating less than 3/10.                -Improve Foot and Ankle Disability Index score from 69% disability to 30% disability or less.                -Patient will demonstrate improved right ankle stability to tolerate x15 seconds or greater right SLS with no increase in pain.     Plan: Will further assess left ankle mobility with focus on subtalor (talocalcaneal) mobility due to limitations in eversion as well as pain along the deltoid ligament region.      Total Session Time 35 and Timed code minutes 35  THERAPEUTIC EXERCISE 0 minutes,  ULTRASOUND , and JOINT MOBILIZATION/MFR 35 minutes      Leah Bragg, PTA 04/30/2022 12:52  Rushie Goltz, PT  05/09/2022 14:45

## 2022-05-02 ENCOUNTER — Ambulatory Visit (HOSPITAL_COMMUNITY): Payer: Self-pay

## 2022-05-05 ENCOUNTER — Ambulatory Visit (HOSPITAL_COMMUNITY): Payer: Self-pay

## 2022-05-06 ENCOUNTER — Ambulatory Visit (HOSPITAL_COMMUNITY): Payer: Self-pay

## 2022-05-09 ENCOUNTER — Ambulatory Visit (HOSPITAL_COMMUNITY): Payer: Self-pay

## 2022-06-03 ENCOUNTER — Ambulatory Visit (RURAL_HEALTH_CENTER): Payer: Self-pay | Admitting: Family

## 2022-06-19 ENCOUNTER — Ambulatory Visit (RURAL_HEALTH_CENTER): Payer: Self-pay | Admitting: Family

## 2022-07-29 ENCOUNTER — Other Ambulatory Visit: Payer: Self-pay

## 2022-07-29 ENCOUNTER — Ambulatory Visit (RURAL_HEALTH_CENTER): Payer: Self-pay | Attending: Family | Admitting: Family

## 2022-07-29 ENCOUNTER — Ambulatory Visit: Payer: Self-pay | Attending: Family | Admitting: Family

## 2022-07-29 ENCOUNTER — Encounter (RURAL_HEALTH_CENTER): Payer: Self-pay | Admitting: Family

## 2022-07-29 VITALS — BP 118/80 | HR 52 | Temp 97.4°F | Resp 17 | Ht 66.0 in | Wt 205.6 lb

## 2022-07-29 DIAGNOSIS — Z131 Encounter for screening for diabetes mellitus: Secondary | ICD-10-CM

## 2022-07-29 DIAGNOSIS — R001 Bradycardia, unspecified: Secondary | ICD-10-CM | POA: Insufficient documentation

## 2022-07-29 DIAGNOSIS — Z0000 Encounter for general adult medical examination without abnormal findings: Secondary | ICD-10-CM | POA: Insufficient documentation

## 2022-07-29 DIAGNOSIS — Z1231 Encounter for screening mammogram for malignant neoplasm of breast: Secondary | ICD-10-CM | POA: Insufficient documentation

## 2022-07-29 DIAGNOSIS — Z1211 Encounter for screening for malignant neoplasm of colon: Secondary | ICD-10-CM

## 2022-07-29 LAB — POCT URINE DIPSTICK
BILIRUBIN: NEGATIVE
BLOOD: NEGATIVE
GLUCOSE: NEGATIVE
KETONE: NEGATIVE
NITRITE: NEGATIVE
PH: 5
PROTEIN: NEGATIVE
SPECIFIC GRAVITY: 1.03
UROBILINOGEN: 0.2

## 2022-07-29 LAB — COMPREHENSIVE METABOLIC PANEL, NON-FASTING
ALBUMIN/GLOBULIN RATIO: 1.6 — ABNORMAL HIGH (ref 0.8–1.4)
ALBUMIN: 4.4 g/dL (ref 3.5–5.7)
ALKALINE PHOSPHATASE: 45 U/L (ref 34–104)
ALT (SGPT): 13 U/L (ref 7–52)
ANION GAP: 4 mmol/L (ref 4–13)
AST (SGOT): 14 U/L (ref 13–39)
BILIRUBIN TOTAL: 0.6 mg/dL (ref 0.3–1.2)
BUN/CREA RATIO: 20 (ref 6–22)
BUN: 14 mg/dL (ref 7–25)
CALCIUM, CORRECTED: 9.5 mg/dL (ref 8.9–10.8)
CALCIUM: 9.8 mg/dL (ref 8.6–10.3)
CHLORIDE: 106 mmol/L (ref 98–107)
CO2 TOTAL: 29 mmol/L (ref 21–31)
CREATININE: 0.69 mg/dL (ref 0.60–1.30)
ESTIMATED GFR: 96 mL/min/{1.73_m2} (ref >59)
GLOBULIN: 2.7 — ABNORMAL LOW (ref 2.9–5.4)
GLUCOSE: 111 mg/dL — ABNORMAL HIGH (ref 74–109)
OSMOLALITY, CALCULATED: 279 mOsm/kg (ref 270–290)
POTASSIUM: 3.8 mmol/L (ref 3.5–5.1)
PROTEIN TOTAL: 7.1 g/dL (ref 6.4–8.9)
SODIUM: 139 mmol/L (ref 136–145)

## 2022-07-29 LAB — CBC
HCT: 41.1 % (ref 31.2–41.9)
HGB: 14 g/dL (ref 10.9–14.3)
MCH: 29.8 pg (ref 24.7–32.8)
MCHC: 34 g/dL (ref 32.3–35.6)
MCV: 87.7 fL (ref 75.5–95.3)
MPV: 8.7 fL (ref 7.9–10.8)
PLATELETS: 230 10*3/uL (ref 140–440)
RBC: 4.68 10*6/uL (ref 3.63–4.92)
RDW: 13.2 % (ref 12.3–17.7)
WBC: 5.3 10*3/uL (ref 3.8–11.8)

## 2022-07-29 NOTE — Addendum Note (Signed)
Addended by: Florestine Avers on: 07/29/2022 12:52 PM     Modules accepted: Orders

## 2022-07-29 NOTE — Progress Notes (Signed)
Kelsey Pitts  MRN: [id number]  DOB: Jun 03, 1956  Date of Service: 07/29/2022    CHIEF COMPLAINT  Chief Complaint   Patient presents with    Well Check Adult     Left eye 20/100  Right eye 20/40       HISTORY OF PRESENT ILLNESS  Kelsey Pitts is a 67 y.o. female presenting to clinic to Adult Wellness Visit.     Patient Active Problem List    Diagnosis Date Noted    Colon cancer screening 07/29/2022    Bradycardia 01/02/2022     Managed by [facility].  Dr. Roetta Sessions, Cardiologist.      Dizziness 01/02/2022    Palpitations 01/02/2022    Essential hypertension 10/01/2021     Dr. Ashby Dawes, [facility], [facility].      Mixed hyperlipidemia 10/01/2021    Vitamin D deficiency 10/01/2021    B12 deficiency 10/01/2021    Sleep apnea 10/01/2021    Anxiety 10/01/2021    Obesity (BMI 30.0-34.9) 10/01/2021       PAST MEDICAL HISTORY  Past Medical History:   Diagnosis Date    Bradycardia     Chest discomfort     Clinical diagnosis of severe acute respiratory syndrome coronavirus 2 (SARS-CoV-2) disease     Fatigue     Flank pain     Hypokaluria     Knee pain     Low back pain     Postoperative nausea     Urinary urgency     UTI (urinary tract infection)         MEDICATIONS  Calcium Carbonate 500 mg calcium (1,250 mg) Oral Tablet, Chewable, every day  Cholecalciferol, Vitamin D3, (VITAMIN D) 25 mcg (1,000 unit) Oral Capsule, Take 1 Capsule (1,000 Units total) by mouth Once a day  cyanocobalamin (VITAMIN B12) 1,000 mcg/mL Injection Solution, Inject 1 mL (1,000 mcg total) into the muscle  loratadine (CLARITIN) 10 mg Oral Tablet, Take 1 Tablet (10 mg total) by mouth Once a day  metoprolol succinate (TOPROL-XL) 25 mg Oral Tablet Sustained Release 24 hr, Take 1 Tablet (25 mg total) by mouth Once a day  midodrine (PROAMITINE) 5 mg Oral Tablet, Take 1 Tablet (5 mg total) by mouth Twice daily  rosuvastatin (CRESTOR) 5 mg Oral  Tablet, Take 1 Tablet (5 mg total) by mouth Once a day    No facility-administered medications prior to visit.      ALLERGIES  No Known Allergies    PAST SURGICAL HISTORY  Past Surgical History:   Procedure Laterality Date    BILATERAL OOPHORECTOMY N/A 2005    Dr Dillon Bjork SECTION, CLASSIC N/A 1980    Another one in [place] 2005    Dr Brayton El CHOLECYSTECTOMY  1990    Dr Bernarda Caffey DILATION AND CURETTAGE N/A 2005    Dr Tamera Stands HYSTERECTOMY N/A 2005    Dr Tamera Stands KNEE REPLACMENT N/A 03/30/2019    Dr Grayce Sessions  Immunization History   Administered Date(s) Administered    Covid-19 Vaccine,Pfizer-BioNTech,Purple Top,15yr+ 06/22/2019, 07/13/2019, 03/21/2020, 04/17/2021    Flucelvax Influenza Vaccine, 6 months + 02/11/2018    High-Dose Influenza Vaccine, 65+ 03/16/2022    Influenza Vaccine, 6 month-adult 03/26/2016    PREVNAR 20  03/16/2022    Shingrix - Zoster Vaccine 11/21/2017, 02/11/2018    Tetanus Toxoid/Diphtheria Toxoid/Acellular Pertussis Vaccine, Adsorbed 11/21/2017       FAMILY HISTORY  Family Medical History:       Problem Relation (Age of Onset)    Atrial fibrillation Sister    Congestive Heart Failure Mother    Diabetes type II Mother    Emphysema Mother    Heart Attack Father    Heart Disease Mother, Father, Sister    Hypertension (High Blood Pressure) Mother, Father    No Known Problems Brother, Half-Sister, Half-Brother, Maternal 1, Maternal Uncle, Paternal 20, Paternal Uncle, Maternal Grandmother, Maternal Grandfather, Paternal 54, Paternal 6, Daughter, Son    Pacemaker Sister              SOCIAL HISTORY  Social History     Socioeconomic History    Marital status: Married     Spouse name: Payson Madar    Number of children: 3    Highest education level: 12th grade   Occupational History     Comment: [employer]; housekeeping.   Tobacco Use    Smoking status: Never    Smokeless tobacco: Never   Vaping Use    Vaping  Use: Never used   Substance and Sexual Activity    Alcohol use: Never    Drug use: Never    Sexual activity: Never     Partners: Male     Comment: LMP: 2005, [id number]     Social Determinants of Health     Financial Resource Strain: Low Risk  (07/29/2022)    Financial Resource Strain     SDOH Financial: No   Transportation Needs: Low Risk  (07/29/2022)    Transportation Needs     SDOH Transportation: No   Social Connections: Low Risk  (07/29/2022)    Social Connections     SDOH Social Isolation: 5 or more times a week   Intimate Partner Violence: Low Risk  (07/29/2022)    Intimate Partner Violence     SDOH Domestic Violence: No   Housing Stability: Low Risk  (07/29/2022)    Housing Stability     SDOH Housing Situation: I have housing.     SDOH Housing Worry: No       REVIEW OF SYSTEMS  Positive ROS discussed in HPI, otherwise all other systems negative.      PHYSICAL EXAM  Vitals: Blood pressure 118/80, pulse 52, temperature 36.3 C (97.4 F), resp. rate 17, height 1.676 m (5' 6"$ ), weight 93.3 kg (205 lb 9.6 oz), SpO2 99%. Body mass index is 33.18 kg/m.    Vision screening:  right eye 20/100; Right eye 20/40, corrected.    Physical Exam  Constitutional:       Appearance: She is obese.   HENT:      Head: Normocephalic.      Right Ear: Tympanic membrane and ear canal normal.      Left Ear: Tympanic membrane, ear canal and external ear normal.      Nose: Nose normal.      Mouth/Throat:      Mouth: Mucous membranes are moist.   Eyes:      Conjunctiva/sclera: Conjunctivae normal.      Pupils: Pupils are equal, round, and reactive to light.   Cardiovascular:      Rate and Rhythm: Normal rate and regular rhythm.      Pulses: Normal pulses.   Pulmonary:      Effort: Pulmonary effort  is normal.   Chest:   Breasts:     Right: Normal.      Left: Normal.   Abdominal:      General: Bowel sounds are normal.      Palpations: Abdomen is soft.   Genitourinary:     Comments: deferred  Musculoskeletal:      Cervical back: Neck supple.    Lymphadenopathy:      Upper Body:      Right upper body: No axillary or pectoral adenopathy.      Left upper body: No axillary or pectoral adenopathy.   Skin:     General: Skin is warm.      Capillary Refill: Capillary refill takes less than 2 seconds.   Neurological:      Mental Status: She is alert and oriented to person, place, and time.      Cranial Nerves: Cranial nerves 2-12 are intact.      Motor: Motor function is intact.      Coordination: Coordination is intact.      Gait: Gait is intact.   Psychiatric:         Mood and Affect: Mood normal.         Speech: Speech normal.         Cognition and Memory: Cognition normal.          ASSESSMENT AND PLAN  (Z00.00) Encounter for wellness examination in adult  (primary encounter diagnosis)  Plan: CBC, COMPREHENSIVE METABOLIC PANEL,         NON-FASTING, HGA1C (HEMOGLOBIN A1C WITH EST AVG        GLUCOSE)    (Z13.1) Diabetes mellitus screening  Plan: CBC, COMPREHENSIVE METABOLIC PANEL,         NON-FASTING, HGA1C (HEMOGLOBIN A1C WITH EST AVG        GLUCOSE)    (Z12.11) Colon cancer screening  Plan: Referral to [place], [facility], [facility]    (R00.1) Bradycardia    The PHQ 2 Total: 2 depression screen is interpreted as negative.  Discussed mammogram on 06/27/2021.  Repeat mammogram ordered today.  Bone density scan:  06/27/2021, normal exam.  Advised follow up in 5 years..  Discussed immunizations.  Recommend RSV vaccine.  Otherwise, immunizations up-to-date.  Discussed vision screening.  Patient reports she will schedule her own appointment with Dr. Alveta Heimlich, ophthalmologist.    Discussed safety.  Advised to follow up with dentist every 6 months for dental screenings.  Advised to follow up with Dermatology annually for skin screenings.  Advised a low-fat/low-sodium diet, advised 150 minutes of scheduled weekly physical activity as tolerated and to maintain a healthy weight.     Orders Placed This Encounter    CBC    COMPREHENSIVE  METABOLIC PANEL, NON-FASTING    HGA1C (HEMOGLOBIN A1C WITH EST AVG GLUCOSE)    Referral to [place], [facility], [facility]      Return in about 1 year (around 07/30/2023) for adult wellness; schedule a routine visit.      Jodi Mourning, NP-C 07/29/2022, 12:04

## 2022-07-30 LAB — HGA1C (HEMOGLOBIN A1C WITH EST AVG GLUCOSE): HEMOGLOBIN A1C: 5.6 % (ref 4.0–6.0)

## 2022-08-11 ENCOUNTER — Other Ambulatory Visit (RURAL_HEALTH_CENTER): Payer: Self-pay | Admitting: Family

## 2022-08-12 ENCOUNTER — Encounter (INDEPENDENT_AMBULATORY_CARE_PROVIDER_SITE_OTHER): Payer: Self-pay

## 2022-08-12 DIAGNOSIS — Z8679 Personal history of other diseases of the circulatory system: Secondary | ICD-10-CM | POA: Insufficient documentation

## 2022-08-12 HISTORY — DX: Personal history of other diseases of the circulatory system: Z86.79

## 2022-10-01 ENCOUNTER — Ambulatory Visit (HOSPITAL_COMMUNITY): Payer: Self-pay

## 2022-10-01 ENCOUNTER — Inpatient Hospital Stay
Admission: RE | Admit: 2022-10-01 | Discharge: 2022-10-01 | Disposition: A | Discharge status: Home / Self Care | Payer: No Typology Code available for payment source | Source: Ambulatory Visit | Attending: Family | Admitting: Family

## 2022-10-01 ENCOUNTER — Other Ambulatory Visit: Payer: Self-pay

## 2022-10-01 ENCOUNTER — Encounter (HOSPITAL_COMMUNITY): Payer: Self-pay

## 2022-10-01 DIAGNOSIS — Z1231 Encounter for screening mammogram for malignant neoplasm of breast: Secondary | ICD-10-CM | POA: Insufficient documentation

## 2022-10-01 HISTORY — DX: Encounter for other specified aftercare: Z51.89

## 2022-10-29 ENCOUNTER — Ambulatory Visit (RURAL_HEALTH_CENTER): Payer: Self-pay | Admitting: Family

## 2022-11-11 ENCOUNTER — Ambulatory Visit (RURAL_HEALTH_CENTER): Payer: Self-pay | Admitting: Family

## 2022-11-11 ENCOUNTER — Encounter (RURAL_HEALTH_CENTER): Payer: Self-pay | Admitting: Family

## 2022-11-11 ENCOUNTER — Ambulatory Visit: Payer: Self-pay | Attending: Family | Admitting: Family

## 2022-11-11 ENCOUNTER — Ambulatory Visit (RURAL_HEALTH_CENTER): Payer: Self-pay | Attending: Family | Admitting: Family

## 2022-11-11 ENCOUNTER — Other Ambulatory Visit: Payer: Self-pay

## 2022-11-11 VITALS — BP 139/87 | HR 72 | Temp 97.3°F | Resp 18 | Wt 200.0 lb

## 2022-11-11 DIAGNOSIS — N390 Urinary tract infection, site not specified: Secondary | ICD-10-CM | POA: Insufficient documentation

## 2022-11-11 DIAGNOSIS — R110 Nausea: Secondary | ICD-10-CM | POA: Insufficient documentation

## 2022-11-11 HISTORY — DX: Nausea: R11.0

## 2022-11-11 HISTORY — DX: Urinary tract infection, site not specified: N39.0

## 2022-11-11 LAB — POCT RAPID FLU
INFLUENZA TYPE A: NEGATIVE
INFLUENZA TYPE B: NEGATIVE

## 2022-11-11 LAB — POCT RAPID STREP A: RAPID STREP A (POCT): NEGATIVE

## 2022-11-11 LAB — POCT URINE DIPSTICK
GLUCOSE: NEGATIVE
KETONE: 15
NITRITE: NEGATIVE
PH: 5
PROTEIN: 30
SPECIFIC GRAVITY: 1.03
UROBILINOGEN: 0.2

## 2022-11-11 LAB — POCT RAPID COVID (SOFIA) (AMB ONLY): COVID-19 AG: NEGATIVE

## 2022-11-11 MED ORDER — ONDANSETRON HCL 8 MG TABLET
8.0000 mg | ORAL_TABLET | Freq: Three times a day (TID) | ORAL | 0 refills | Status: DC | PRN
Start: 2022-11-11 — End: 2022-11-25

## 2022-11-11 MED ORDER — SULFAMETHOXAZOLE 800 MG-TRIMETHOPRIM 160 MG TABLET
1.0000 | ORAL_TABLET | Freq: Two times a day (BID) | ORAL | 0 refills | Status: DC
Start: 2022-11-11 — End: 2022-11-25

## 2022-11-11 MED ORDER — SULFAMETHOXAZOLE 800 MG-TRIMETHOPRIM 160 MG TABLET
1.0000 | ORAL_TABLET | Freq: Two times a day (BID) | ORAL | 0 refills | Status: DC
Start: 2022-11-11 — End: 2022-11-11

## 2022-11-11 NOTE — Progress Notes (Signed)
[facility] MEDICINE [facility]  [facility]      JEHILYN DELAHOYA  MRN: [id number]  DOB: 06/24/1955  Date of Service: 11/11/2022    CHIEF COMPLAINT  Chief Complaint   Patient presents with    Feel Sick     Vomiting, diarrhea, and possible uti        SUBJECTIVE  Kelsey Pitts is a 67 y.o. female who presents to clinic for abdominal cramping, nausea, and vomiting x 1 week.  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  Patient reports she has missed a few days of work due to symptoms.  Denies fever, cough, vomiting or diarrhea.  Patient reports she has been having burning with urination x 3 days.  Denies hematuria.  Denies hx of kidney stones. Denies vaginal discharge. Denies recent antibiotic use.  Denies recent hospitalizations or surgery.       Review of Systems:  Positive ROS discussed in HPI, otherwise all other systems negative.      Medications:   Calcium Carbonate 500 mg calcium (1,250 mg) Oral Tablet, Chewable, every day  Cholecalciferol, Vitamin D3, (VITAMIN D) 25 mcg (1,000 unit) Oral Capsule, Take 1 Capsule (1,000 Units total) by mouth Once a day  cyanocobalamin (VITAMIN B12) 1,000 mcg/mL Injection Solution, Inject 1 mL (1,000 mcg total) into the muscle  ergocalciferol, vitamin D2, (DRISDOL) 1,250 mcg (50,000 unit) Oral Capsule, Take 1 Capsule (50,000 Units total) by mouth Every 7 days  loratadine (CLARITIN) 10 mg Oral Tablet, Take 1 Tablet (10 mg total) by mouth Once a day  metoprolol succinate (TOPROL-XL) 25 mg Oral Tablet Sustained Release 24 hr, Take 1 Tablet (25 mg total) by mouth Once a day  midodrine (PROAMITINE) 5 mg Oral Tablet, Take 1 Tablet (5 mg total) by mouth Twice daily  rosuvastatin (CRESTOR) 5 mg Oral Tablet, Take 1 Tablet (5 mg total) by mouth Once a day    No facility-administered medications prior to visit.      Allergies:   No Known Allergies      OBJECTIVE  BP 139/87   Pulse 72   Temp 36.3 C (97.3 F)   Resp 18    Wt 90.7 kg (200 lb)   LMP  (LMP Unknown)   SpO2 94%   BMI 32.28 kg/m       Physical Exam  Vitals reviewed.   Constitutional:       Appearance: She is obese.   Cardiovascular:      Rate and Rhythm: Normal rate.      Pulses: Normal pulses.   Pulmonary:      Effort: Pulmonary effort is normal.      Breath sounds: Normal breath sounds.   Abdominal:      Tenderness: There is no abdominal tenderness. There is no right CVA tenderness, left CVA tenderness or guarding.   Skin:     Capillary Refill: Capillary refill takes less than 2 seconds.   Neurological:      General: No focal deficit present.      Mental Status: She is alert and oriented to person, place, and time.   Psychiatric:         Mood and Affect: Mood normal.         Behavior: Behavior normal.           ASSESSMENT/PLAN  (N39.0) UTI (urinary tract infection)  (primary encounter diagnosis)  Plan: POCT Urine Dipstick, POCT Rapid Strep, POCT  Rapid Flu, POCT Rapid Covid Keenan Bachelor), URINE         CULTURE    (R11.0) Nausea  Plan: POCT Urine Dipstick, POCT Rapid Strep, POCT         Rapid Flu, POCT Rapid Covid Keenan Bachelor), URINE         CULTURE       Problem List Items Addressed This Visit          Nephrology    UTI (urinary tract infection) - Primary     Start Bactrim DS BID x 10 days for UTI.  Hydrate well.  Limit caffeine use to no more than 8 oz daily. Advise to follow up symptoms progress or do not resolve.          Relevant Orders    POCT Urine Dipstick    POCT Rapid Strep    POCT Rapid Flu    POCT Rapid Covid Keenan Bachelor)    URINE CULTURE       Digestive    Nausea     Start Zofran 8mg  q 8 hours PRN.  Advise a bland diet until a regular diet can be resumed.         Relevant Orders    POCT Urine Dipstick    POCT Rapid Strep    POCT Rapid Flu    POCT Rapid Covid Keenan Bachelor)    URINE CULTURE      Orders Placed This Encounter    URINE CULTURE    POCT Urine Dipstick    POCT Rapid Strep    POCT Rapid Flu    POCT Rapid Covid Keenan Bachelor)    trimethoprim-sulfamethoxazole (BACTRIM  DS) 160-800mg  per tablet    ondansetron (ZOFRAN) 8 mg Oral Tablet        Return in about 2 weeks (around 11/25/2022) for reschedule routine visit for 2 weeks.Stormy Fabian, NP-C 11/11/2022, 13:50

## 2022-11-11 NOTE — Addendum Note (Signed)
Addended by: Lamont Dowdy on: 11/11/2022 04:26 PM     Modules accepted: Orders

## 2022-11-11 NOTE — Assessment & Plan Note (Signed)
Start Bactrim DS BID x 10 days for UTI.  Hydrate well.  Limit caffeine use to no more than 8 oz daily. Advise to follow up symptoms progress or do not resolve.

## 2022-11-11 NOTE — Assessment & Plan Note (Signed)
Start Zofran 8mg  q 8 hours PRN.  Advise a bland diet until a regular diet can be resumed.

## 2022-11-13 ENCOUNTER — Encounter (RURAL_HEALTH_CENTER): Payer: Self-pay | Admitting: Family

## 2022-11-14 LAB — URINE CULTURE: URINE CULTURE: 50000 — AB

## 2022-11-25 ENCOUNTER — Encounter (RURAL_HEALTH_CENTER): Payer: Self-pay | Admitting: Family

## 2022-11-25 ENCOUNTER — Ambulatory Visit (RURAL_HEALTH_CENTER): Payer: No Typology Code available for payment source | Attending: Family | Admitting: Family

## 2022-11-25 ENCOUNTER — Other Ambulatory Visit: Payer: Self-pay

## 2022-11-25 VITALS — BP 138/86 | HR 60 | Temp 98.3°F | Resp 16 | Ht 66.0 in | Wt 204.0 lb

## 2022-11-25 DIAGNOSIS — E559 Vitamin D deficiency, unspecified: Secondary | ICD-10-CM | POA: Insufficient documentation

## 2022-11-25 DIAGNOSIS — E782 Mixed hyperlipidemia: Secondary | ICD-10-CM | POA: Insufficient documentation

## 2022-11-25 DIAGNOSIS — E6609 Other obesity due to excess calories: Secondary | ICD-10-CM | POA: Insufficient documentation

## 2022-11-25 DIAGNOSIS — Z6833 Body mass index (BMI) 33.0-33.9, adult: Secondary | ICD-10-CM | POA: Insufficient documentation

## 2022-11-25 DIAGNOSIS — E538 Deficiency of other specified B group vitamins: Secondary | ICD-10-CM | POA: Insufficient documentation

## 2022-11-25 DIAGNOSIS — G4733 Obstructive sleep apnea (adult) (pediatric): Secondary | ICD-10-CM | POA: Insufficient documentation

## 2022-11-25 DIAGNOSIS — I10 Essential (primary) hypertension: Secondary | ICD-10-CM | POA: Insufficient documentation

## 2022-11-25 NOTE — Assessment & Plan Note (Signed)
Advise a low fat/low sodium diet, advise 150 min of exercise and to maintain a healthy weight.

## 2022-11-25 NOTE — Assessment & Plan Note (Signed)
Continue vitamin B12 1000 mcg daily.

## 2022-11-25 NOTE — Assessment & Plan Note (Signed)
Compliant with CPAP 

## 2022-11-25 NOTE — Progress Notes (Signed)
FAMILY MEDICINE, [facility] FAMILY MEDICINE [facility]  [address]  [place] [address]  Operated by [facility]     Name: Kelsey Pitts MRN:  [id number]   Date of Birth: [birth year] Age: 67 y.o.   Date: 11/25/2022  Time: 13:40     Provider: Stormy Fabian, NP-C    Reason for visit: Follow Up (2wk f/u routine visit)      History of Present Illness:  Kelsey Pitts is a 67 y.o. female presenting with chronic disease management.  No concerns voiced today.      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  Dr. Bailey Mech, Cardiologist.      Palpitations 01/02/2022    Hyperlipidemia 10/01/2021    B12 deficiency 10/01/2021    Anxiety 10/01/2021    Hypertension, essential 04/15/2018     Dr. Nicholos Johns, [facility], [facility].      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   ICD10 Conversion    Clinical diagnosis of severe acute respiratory syndrome coronavirus 2 (SARS-CoV-2) disease     Dizzy spells 03/01/2012    DOE (dyspnea on exertion) 03/01/2012    Family history of ischemic heart disease and other diseases of the circulatory system 03/01/2012    Formatting of this note might be different from the original.   IMO Load 2016 R1.3    Fatigue     Flank pain     History of hypotension 08/12/2022    Hypokaluria     Knee pain     Low back pain     Malaise and fatigue 03/01/2012    Nausea 11/11/2022    Postoperative nausea     Snores 03/01/2012    Treatment     Urinary urgency      UTI (urinary tract infection)     UTI (urinary tract infection) 11/11/2022     Past Surgical History:  Past Surgical History:   Procedure Laterality Date    BILATERAL OOPHORECTOMY N/A 2005    Dr Jaci Standard    CESAREAN SECTION, CLASSIC N/A 1980    Another one in 87    HX APPENDECTOMY N/A 2005    Dr Lincoln Brigham CHOLECYSTECTOMY  1990    Dr Arleta Creek DILATION AND CURETTAGE N/A 2005    Dr Lendon Collar HYSTERECTOMY N/A 2005    Dr Lendon Collar KNEE REPLACMENT N/A 03/30/2019    Dr Lequita Halt     Allergies:  No Known Allergies  Medications:  Current Outpatient Medications   Medication Sig    Calcium Carbonate 500 mg calcium (1,250 mg) Oral Tablet, Chewable every day    ergocalciferol, vitamin D2, (DRISDOL) 1,250 mcg (50,000 unit) Oral Capsule Take 1 Capsule (50,000 Units total) by mouth Every 7 days    loratadine (CLARITIN) 10 mg Oral  Tablet Take 1 Tablet (10 mg total) by mouth Once a day    metoprolol succinate (TOPROL-XL) 25 mg Oral Tablet Sustained Release 24 hr Take 1 Tablet (25 mg total) by mouth Once a day    midodrine (PROAMITINE) 5 mg Oral Tablet Take 1 Tablet (5 mg total) by mouth Twice daily    rosuvastatin (CRESTOR) 5 mg Oral Tablet Take 1 Tablet (5 mg total) by mouth Once a day     Family History:  Family Medical History:       Problem Relation (Age of Onset)    Atrial fibrillation Sister    Congestive Heart Failure Mother    Diabetes type II Mother    Emphysema Mother    Heart Attack Father    Heart Disease Mother, Father, Sister    Hypertension (High Blood Pressure) Mother, Father    No Known Problems Brother, Half-Sister, Half-Brother, Maternal Aunt, Maternal Uncle, Paternal Aunt, Paternal Uncle, Maternal Grandmother, Maternal Grandfather, Paternal Grandmother, Paternal Grandfather, Daughter, Son    Pacemaker Sister            Social History:  Social History     Socioeconomic History    Marital status: Married     Spouse name: Onesti Conard    Number of children: 3    Highest education level: 12th grade    Occupational History     Comment: [employer]; housekeeping.   Tobacco Use    Smoking status: Never    Smokeless tobacco: Never   Vaping Use    Vaping status: Never Used   Substance and Sexual Activity    Alcohol use: Never    Drug use: Never    Sexual activity: Never     Partners: Male     Comment: LMP: 2005, [id number]     Social Determinants of Health     Financial Resource Strain: Low Risk  (11/25/2022)    Financial Resource Strain     SDOH Financial: No   Transportation Needs: Low Risk  (11/25/2022)    Transportation Needs     SDOH Transportation: No   Social Connections: Low Risk  (11/25/2022)    Social Connections     SDOH Social Isolation: 5 or more times a week   Intimate Partner Violence: Low Risk  (11/25/2022)    Intimate Partner Violence     SDOH Domestic Violence: No   Housing Stability: Low Risk  (11/25/2022)    Housing Stability     SDOH Housing Situation: I have housing.     SDOH Housing Worry: No           Review of Systems:  Any pertinent Review of Systems as addressed in the HPI above.    Physical Exam:  Vital Signs:  Vitals:    11/25/22 1318   BP: 138/86   Pulse: 60   Resp: 16   Temp: 36.8 C (98.3 F)   SpO2: 95%   Weight: 92.5 kg (204 lb)   Height: 1.676 m (5\' 6" )   BMI: 33     Physical Exam  Constitutional:       Appearance: She is obese.   HENT:      Head: Normocephalic.   Cardiovascular:      Rate and Rhythm: Normal rate and regular rhythm.      Pulses: Normal pulses.      Heart sounds: Normal heart sounds, S1 normal and S2 normal.   Pulmonary:      Effort: Pulmonary effort is normal.  Breath sounds: Normal breath sounds.   Abdominal:      General: Bowel sounds are normal.      Palpations: Abdomen is soft.   Musculoskeletal:         General: Normal range of motion.      Cervical back: Neck supple.      Right lower leg: No edema.      Left lower leg: No edema.   Skin:     General: Skin is warm.   Neurological:      General: No focal deficit present.      Mental Status: She is alert and  oriented to person, place, and time. Mental status is at baseline.      Sensory: Sensation is intact.      Motor: Motor function is intact.      Coordination: Coordination is intact.      Gait: Gait is intact.   Psychiatric:         Mood and Affect: Mood normal.         Behavior: Behavior normal. Behavior is cooperative.         Thought Content: Thought content normal.         Cognition and Memory: Cognition normal.         Judgment: Judgment normal.          Assessment/Plan:  (I10) Hypertension, essential  (primary encounter diagnosis)    (E78.2) Mixed hyperlipidemia    (G47.33) Obstructive sleep apnea syndrome    (E53.8) B12 deficiency    (E55.9) Vitamin D deficiency    (E66.09) Obesity due to excess calories, unspecified classification, unspecified whether serious comorbidity present    Problem List Items Addressed This Visit          Cardiovascular System    Hypertension, essential - Primary     BP controlled.  Managed by Cardiology.  Continue current medications.         Hyperlipidemia     Continue rosuvastatin 5mg  daily. Advised to limit high fat foods, processed foods and fast food in diet.             Respiratory    Sleep apnea     Compliant with CPAP.            Endocrine    Vitamin D deficiency     Continue weekly vitamin D 50,000 IU         B12 deficiency     Continue vitamin B12 daily.            Other    Obesity     Advise a low fat/low sodium diet, advise 150 min of exercise and to maintain a healthy weight.           Labs drawn today.  Continue current medications.  Advised a low-fat/low sodium diet, advised 150 minutes of scheduled weekly physical activity as tolerated.  Advised to maintain a healthy weight.     Return in about 3 months (around 02/25/2023) for routine visit; schedule adult wellness visit.    Stormy Fabian, NP-C     Portions of this note may be dictated using voice recognition software or a dictation service. Variances in spelling and vocabulary are possible and unintentional.  Not all errors are caught/corrected. Please notify the Thereasa Parkin if any discrepancies are noted or if the meaning of any statement is not clear.

## 2022-11-25 NOTE — Assessment & Plan Note (Signed)
BP controlled.  Managed by Cardiology.  Continue current medications.

## 2022-11-25 NOTE — Assessment & Plan Note (Signed)
Continue rosuvastatin 5mg  daily. Advised to limit high fat foods, processed foods and fast food in diet.

## 2022-11-25 NOTE — Assessment & Plan Note (Signed)
Continue weekly vitamin D 50, 000 IU

## 2022-11-26 ENCOUNTER — Ambulatory Visit: Payer: Self-pay | Attending: Family

## 2022-11-26 DIAGNOSIS — E538 Deficiency of other specified B group vitamins: Secondary | ICD-10-CM | POA: Insufficient documentation

## 2022-11-26 DIAGNOSIS — I10 Essential (primary) hypertension: Secondary | ICD-10-CM | POA: Insufficient documentation

## 2022-11-26 DIAGNOSIS — E6609 Other obesity due to excess calories: Secondary | ICD-10-CM | POA: Insufficient documentation

## 2022-11-26 DIAGNOSIS — E559 Vitamin D deficiency, unspecified: Secondary | ICD-10-CM | POA: Insufficient documentation

## 2022-11-26 DIAGNOSIS — G4733 Obstructive sleep apnea (adult) (pediatric): Secondary | ICD-10-CM | POA: Insufficient documentation

## 2022-11-26 DIAGNOSIS — E782 Mixed hyperlipidemia: Secondary | ICD-10-CM | POA: Insufficient documentation

## 2022-11-26 LAB — COMPREHENSIVE METABOLIC PNL, FASTING
ALBUMIN/GLOBULIN RATIO: 1.7 — ABNORMAL HIGH (ref 0.8–1.4)
ALBUMIN: 4.1 g/dL (ref 3.5–5.7)
ALKALINE PHOSPHATASE: 56 U/L (ref 34–104)
ALT (SGPT): 14 U/L (ref 7–52)
ANION GAP: 5 mmol/L (ref 4–13)
AST (SGOT): 13 U/L (ref 13–39)
BILIRUBIN TOTAL: 0.6 mg/dL (ref 0.3–1.2)
BUN/CREA RATIO: 28 — ABNORMAL HIGH (ref 6–22)
BUN: 21 mg/dL (ref 7–25)
CALCIUM, CORRECTED: 9.6 mg/dL (ref 8.9–10.8)
CALCIUM: 9.7 mg/dL (ref 8.6–10.3)
CHLORIDE: 105 mmol/L (ref 98–107)
CO2 TOTAL: 29 mmol/L (ref 21–31)
CREATININE: 0.76 mg/dL (ref 0.60–1.30)
ESTIMATED GFR: 86 mL/min/{1.73_m2} (ref >59)
GLOBULIN: 2.4 — ABNORMAL LOW (ref 2.9–5.4)
GLUCOSE: 114 mg/dL — ABNORMAL HIGH (ref 74–109)
OSMOLALITY, CALCULATED: 281 mOsm/kg (ref 270–290)
POTASSIUM: 4 mmol/L (ref 3.5–5.1)
PROTEIN TOTAL: 6.5 g/dL (ref 6.4–8.9)
SODIUM: 139 mmol/L (ref 136–145)

## 2022-11-26 LAB — CBC
HCT: 39.5 % (ref 31.2–41.9)
HGB: 13.1 g/dL (ref 10.9–14.3)
MCH: 29 pg (ref 24.7–32.8)
MCHC: 33.2 g/dL (ref 32.3–35.6)
MCV: 87.3 fL (ref 75.5–95.3)
MPV: 8.5 fL (ref 7.9–10.8)
PLATELETS: 221 10*3/uL (ref 140–440)
RBC: 4.53 10*6/uL (ref 3.63–4.92)
RDW: 13.3 % (ref 12.3–17.7)
WBC: 5 10*3/uL (ref 3.8–11.8)

## 2022-11-26 LAB — LIPID PANEL
CHOL/HDL RATIO: 2.4
CHOLESTEROL: 149 mg/dL (ref <200)
HDL CHOL: 62 mg/dL (ref >=40)
LDL CALC: 74 mg/dL (ref 0–100)
TRIGLYCERIDES: 67 mg/dL (ref <=150)
VLDL CALC: 13 mg/dL (ref 0–50)

## 2022-11-26 LAB — MAGNESIUM: MAGNESIUM: 2.2 mg/dL (ref 1.9–2.7)

## 2022-11-26 LAB — VITAMIN D 25 TOTAL: VITAMIN D: 27 ng/mL — ABNORMAL LOW (ref 30–100)

## 2022-11-26 LAB — VITAMIN B12: VITAMIN B 12: 429 pg/mL (ref 180–914)

## 2022-11-26 LAB — THYROID STIMULATING HORMONE (SENSITIVE TSH): TSH: 2.654 u[IU]/mL (ref 0.450–5.330)

## 2022-11-26 NOTE — Addendum Note (Signed)
Addended by: Lamont Dowdy on: 11/26/2022 11:56 AM     Modules accepted: Orders

## 2022-12-04 ENCOUNTER — Other Ambulatory Visit (RURAL_HEALTH_CENTER): Payer: Self-pay | Admitting: Family

## 2022-12-04 ENCOUNTER — Ambulatory Visit (RURAL_HEALTH_CENTER): Payer: Self-pay | Admitting: Family

## 2022-12-04 MED ORDER — ERGOCALCIFEROL (VITAMIN D2) 1,250 MCG (50,000 UNIT) CAPSULE
50000.0000 [IU] | ORAL_CAPSULE | ORAL | 1 refills | Status: DC
Start: 2022-12-04 — End: 2023-02-09

## 2023-02-09 ENCOUNTER — Ambulatory Visit (RURAL_HEALTH_CENTER): Payer: Self-pay | Attending: Family | Admitting: Family

## 2023-02-09 ENCOUNTER — Encounter (RURAL_HEALTH_CENTER): Payer: Self-pay | Admitting: Family

## 2023-02-09 ENCOUNTER — Other Ambulatory Visit: Payer: Self-pay

## 2023-02-09 VITALS — BP 154/74 | HR 53 | Temp 98.2°F | Ht 66.0 in | Wt 205.0 lb

## 2023-02-09 DIAGNOSIS — Z6833 Body mass index (BMI) 33.0-33.9, adult: Secondary | ICD-10-CM | POA: Insufficient documentation

## 2023-02-09 DIAGNOSIS — I10 Essential (primary) hypertension: Secondary | ICD-10-CM | POA: Insufficient documentation

## 2023-02-09 DIAGNOSIS — G4733 Obstructive sleep apnea (adult) (pediatric): Secondary | ICD-10-CM | POA: Insufficient documentation

## 2023-02-09 DIAGNOSIS — E559 Vitamin D deficiency, unspecified: Secondary | ICD-10-CM | POA: Insufficient documentation

## 2023-02-09 DIAGNOSIS — E538 Deficiency of other specified B group vitamins: Secondary | ICD-10-CM | POA: Insufficient documentation

## 2023-02-09 DIAGNOSIS — E669 Obesity, unspecified: Secondary | ICD-10-CM | POA: Insufficient documentation

## 2023-02-09 DIAGNOSIS — E785 Hyperlipidemia, unspecified: Secondary | ICD-10-CM | POA: Insufficient documentation

## 2023-02-09 DIAGNOSIS — J3089 Other allergic rhinitis: Secondary | ICD-10-CM | POA: Insufficient documentation

## 2023-02-09 MED ORDER — ERGOCALCIFEROL (VITAMIN D2) 1,250 MCG (50,000 UNIT) CAPSULE
50000.0000 [IU] | ORAL_CAPSULE | ORAL | 1 refills | Status: DC
Start: 2023-02-09 — End: 2023-05-21

## 2023-02-09 MED ORDER — LORATADINE 10 MG TABLET
10.0000 mg | ORAL_TABLET | Freq: Every day | ORAL | 1 refills | Status: DC
Start: 2023-02-09 — End: 2023-05-21

## 2023-02-09 MED ORDER — ROSUVASTATIN 5 MG TABLET
5.0000 mg | ORAL_TABLET | Freq: Every day | ORAL | 1 refills | Status: DC
Start: 2023-02-09 — End: 2023-05-21

## 2023-02-09 MED ORDER — METOPROLOL SUCCINATE ER 25 MG TABLET,EXTENDED RELEASE 24 HR
25.0000 mg | ORAL_TABLET | Freq: Every day | ORAL | 1 refills | Status: DC
Start: 2023-02-09 — End: 2023-05-21

## 2023-02-09 MED ORDER — ERGOCALCIFEROL (VITAMIN D2) 1,250 MCG (50,000 UNIT) CAPSULE
50000.0000 [IU] | ORAL_CAPSULE | ORAL | 1 refills | Status: DC
Start: 2023-02-09 — End: 2023-02-09

## 2023-02-09 NOTE — Assessment & Plan Note (Signed)
Continue weekly vitamin D 50, 000 IU

## 2023-02-09 NOTE — Progress Notes (Signed)
FAMILY MEDICINE, [facility] FAMILY MEDICINE [facility]  [address]  [place] [address]  Operated by [facility]     Name: Kelsey Pitts MRN:  [id number]   Date of Birth: 12-31-1955 Age: 67 y.o.   Date: 02/09/2023  Time: 14:38     Provider: Stormy Fabian, NP-C    Reason for visit: Follow Up 3 Months      History of Present Illness:  Kelsey Pitts is a 67 y.o. female presenting with chronic disease management.  No concerns voiced today.      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  Dr. Bailey Mech, Cardiologist.      Palpitations 01/02/2022    Hyperlipidemia 10/01/2021    B12 deficiency 10/01/2021    Anxiety 10/01/2021    Hypertension, essential 04/15/2018     Managed by [facility].  Dr. Bailey Mech, Cardiologist.      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   ICD10 Conversion    Clinical diagnosis of severe acute respiratory syndrome coronavirus 2 (SARS-CoV-2) disease     Dizzy spells 03/01/2012    DOE (dyspnea on exertion) 03/01/2012    Family history of ischemic heart disease and other diseases of the circulatory system 03/01/2012    Formatting of this note might be different from the original.   IMO Load 2016 R1.3    Fatigue     Flank pain     History of hypotension 08/12/2022    Hypokaluria     Knee pain     Low back pain     Malaise and fatigue 03/01/2012    Nausea 11/11/2022    Postoperative nausea     Snores  03/01/2012    Treatment     Urinary urgency     UTI (urinary tract infection)     UTI (urinary tract infection) 11/11/2022     Past Surgical History:  Past Surgical History:   Procedure Laterality Date    BILATERAL OOPHORECTOMY N/A 2005    Dr Joylene John SECTION, CLASSIC N/A 1980    Another one in 18    HX APPENDECTOMY N/A 2005    Dr Lincoln Brigham CHOLECYSTECTOMY  1990    Dr Arleta Creek DILATION AND CURETTAGE N/A 2005    Dr Lendon Collar HYSTERECTOMY N/A 2005    Dr Lendon Collar KNEE REPLACMENT N/A 03/30/2019    Dr Lequita Halt     Allergies:  No Known Allergies  Medications:  Current Outpatient Medications   Medication Sig    Calcium Carbonate 500 mg calcium (1,250 mg) Oral Tablet, Chewable every day    ergocalciferol, vitamin D2, (DRISDOL) 1,250 mcg (50,000 unit) Oral Capsule Take 1 Capsule (50,000 Units total) by mouth Every 7  days for 90 days    loratadine (CLARITIN) 10 mg Oral Tablet Take 1 Tablet (10 mg total) by mouth Once a day for 90 days    metoprolol succinate (TOPROL-XL) 25 mg Oral Tablet Sustained Release 24 hr Take 1 Tablet (25 mg total) by mouth Once a day for 90 days    midodrine (PROAMITINE) 5 mg Oral Tablet Take 1 Tablet (5 mg total) by mouth Twice daily    rosuvastatin (CRESTOR) 5 mg Oral Tablet Take 1 Tablet (5 mg total) by mouth Once a day for 90 days     Family History:  Family Medical History:       Problem Relation (Age of Onset)    Atrial fibrillation Sister    Congestive Heart Failure Mother    Diabetes type II Mother    Emphysema Mother    Heart Attack Father    Heart Disease Mother, Father, Sister    Hypertension (High Blood Pressure) Mother, Father    No Known Problems Brother, Half-Sister, Half-Brother, Maternal Aunt, Maternal Uncle, Paternal Aunt, Paternal Uncle, Maternal Grandmother, Maternal Grandfather, Paternal Grandmother, Paternal Grandfather, Daughter, Son    Pacemaker Sister            Social History:  Social History     Socioeconomic History    Marital status: Married      Spouse name: Layten Klepp    Number of children: 3    Highest education level: 12th grade   Occupational History     Comment: [employer]; housekeeping.   Tobacco Use    Smoking status: Never    Smokeless tobacco: Never   Vaping Use    Vaping status: Never Used   Substance and Sexual Activity    Alcohol use: Never    Drug use: Never    Sexual activity: Never     Partners: Male     Comment: LMP: 2005, [id number]     Social Determinants of Health     Financial Resource Strain: Low Risk  (11/25/2022)    Financial Resource Strain     SDOH Financial: No   Transportation Needs: Low Risk  (11/25/2022)    Transportation Needs     SDOH Transportation: No   Social Connections: Low Risk  (11/25/2022)    Social Connections     SDOH Social Isolation: 5 or more times a week   Intimate Partner Violence: Low Risk  (11/25/2022)    Intimate Partner Violence     SDOH Domestic Violence: No   Housing Stability: Low Risk  (11/25/2022)    Housing Stability     SDOH Housing Situation: I have housing.     SDOH Housing Worry: No           Review of Systems:  Any pertinent Review of Systems as addressed in the HPI above.    Physical Exam:  Vital Signs:  Vitals:    02/09/23 1422   BP: (!) 154/74   Pulse: 53   Temp: 36.8 C (98.2 F)   Weight: 93 kg (205 lb)   Height: 1.676 m (5\' 6" )   BMI: 33.16     Physical Exam  Constitutional:       Appearance: She is obese.   HENT:      Head: Normocephalic.   Cardiovascular:      Rate and Rhythm: Normal rate and regular rhythm.      Pulses: Normal pulses.      Heart sounds: Normal heart sounds, S1 normal and S2 normal.  Pulmonary:      Effort: Pulmonary effort is normal.      Breath sounds: Normal breath sounds.   Abdominal:      General: Bowel sounds are normal.      Palpations: Abdomen is soft.   Musculoskeletal:         General: Normal range of motion.      Cervical back: Neck supple.      Right lower leg: No edema.      Left lower leg: No edema.   Skin:     General: Skin is warm.   Neurological:       General: No focal deficit present.      Mental Status: She is alert and oriented to person, place, and time. Mental status is at baseline.      Sensory: Sensation is intact.      Motor: Motor function is intact.      Coordination: Coordination is intact.      Gait: Gait is intact.   Psychiatric:         Mood and Affect: Mood normal.         Behavior: Behavior normal. Behavior is cooperative.         Thought Content: Thought content normal.         Cognition and Memory: Cognition normal.         Judgment: Judgment normal.          Assessment/Plan:  (E78.5) Hyperlipidemia  (primary encounter diagnosis)  Plan: CBC, COMPREHENSIVE METABOLIC PNL, FASTING,         LIPID PANEL, MAGNESIUM, VITAMIN D 25 TOTAL,         VITAMIN B12, CANCELED: HGA1C (HEMOGLOBIN A1C         WITH EST AVG GLUCOSE), CANCELED:         MICROALBUMIN/CREATININE RATIO, URINE, RANDOM,         CANCELED: THYROID STIMULATING HORMONE         (SENSITIVE TSH)    (I10) Hypertension, essential  Plan: CBC, COMPREHENSIVE METABOLIC PNL, FASTING,         LIPID PANEL, MAGNESIUM, VITAMIN D 25 TOTAL,         VITAMIN B12, CANCELED: HGA1C (HEMOGLOBIN A1C         WITH EST AVG GLUCOSE), CANCELED:         MICROALBUMIN/CREATININE RATIO, URINE, RANDOM,         CANCELED: THYROID STIMULATING HORMONE         (SENSITIVE TSH)    (G47.33) Obstructive sleep apnea syndrome  Plan: CBC, COMPREHENSIVE METABOLIC PNL, FASTING,         LIPID PANEL, MAGNESIUM, VITAMIN D 25 TOTAL,         VITAMIN B12, CANCELED: HGA1C (HEMOGLOBIN A1C         WITH EST AVG GLUCOSE), CANCELED:         MICROALBUMIN/CREATININE RATIO, URINE, RANDOM,         CANCELED: THYROID STIMULATING HORMONE         (SENSITIVE TSH)    (E53.8) B12 deficiency  Plan: CBC, COMPREHENSIVE METABOLIC PNL, FASTING,         LIPID PANEL, MAGNESIUM, VITAMIN D 25 TOTAL,         VITAMIN B12, CANCELED: HGA1C (HEMOGLOBIN A1C         WITH EST AVG GLUCOSE), CANCELED:         MICROALBUMIN/CREATININE RATIO, URINE, RANDOM,         CANCELED: THYROID  STIMULATING HORMONE         (  SENSITIVE TSH)    (E55.9) Vitamin D deficiency  Plan: CBC, COMPREHENSIVE METABOLIC PNL, FASTING,         LIPID PANEL, MAGNESIUM, VITAMIN D 25 TOTAL,         VITAMIN B12, CANCELED: HGA1C (HEMOGLOBIN A1C         WITH EST AVG GLUCOSE), CANCELED:         MICROALBUMIN/CREATININE RATIO, URINE, RANDOM,         CANCELED: THYROID STIMULATING HORMONE         (SENSITIVE TSH)    (E66.9) Obesity  Plan: CBC, COMPREHENSIVE METABOLIC PNL, FASTING,         LIPID PANEL, MAGNESIUM, VITAMIN D 25 TOTAL,         VITAMIN B12, CANCELED: HGA1C (HEMOGLOBIN A1C         WITH EST AVG GLUCOSE), CANCELED:         MICROALBUMIN/CREATININE RATIO, URINE, RANDOM,         CANCELED: THYROID STIMULATING HORMONE         (SENSITIVE TSH)    (J30.89) Environmental and seasonal allergies  Plan: CBC, COMPREHENSIVE METABOLIC PNL, FASTING,         LIPID PANEL, MAGNESIUM, VITAMIN D 25 TOTAL,         VITAMIN B12, CANCELED: HGA1C (HEMOGLOBIN A1C         WITH EST AVG GLUCOSE), CANCELED:         MICROALBUMIN/CREATININE RATIO, URINE, RANDOM,         CANCELED: THYROID STIMULATING HORMONE         (SENSITIVE TSH)      Problem List Items Addressed This Visit          Cardiovascular System    Hypertension, essential     BP elevated.  Managed by Cardiology.  Continue current medications.         Relevant Orders    CBC    COMPREHENSIVE METABOLIC PNL, FASTING    LIPID PANEL    MAGNESIUM    VITAMIN D 25 TOTAL    VITAMIN B12    Hyperlipidemia - Primary     Continue rosuvastatin 5mg  daily. Advised to limit high fat foods, processed foods and fast food in diet.          Relevant Orders    CBC    COMPREHENSIVE METABOLIC PNL, FASTING    LIPID PANEL    MAGNESIUM    VITAMIN D 25 TOTAL    VITAMIN B12       Respiratory    Sleep apnea     Compliant with CPAP.         Relevant Orders    CBC    COMPREHENSIVE METABOLIC PNL, FASTING    LIPID PANEL    MAGNESIUM    VITAMIN D 25 TOTAL    VITAMIN B12       Endocrine    Vitamin D deficiency     Continue weekly  vitamin D 50,000 IU         Relevant Orders    CBC    COMPREHENSIVE METABOLIC PNL, FASTING    LIPID PANEL    MAGNESIUM    VITAMIN D 25 TOTAL    VITAMIN B12    B12 deficiency     Continue vitamin B12 daily.         Relevant Orders    CBC    COMPREHENSIVE METABOLIC PNL, FASTING    LIPID PANEL    MAGNESIUM    VITAMIN D  25 TOTAL    VITAMIN B12       Other    Obesity     Advise a low fat/low sodium diet, advise 150 min of exercise and to maintain a healthy weight.          Relevant Orders    CBC    COMPREHENSIVE METABOLIC PNL, FASTING    LIPID PANEL    MAGNESIUM    VITAMIN D 25 TOTAL    VITAMIN B12    Environmental and seasonal allergies     Symptoms controlled with Claritin 10 mg daily as needed.  Hydrate well.  Advised over-the-counter Tylenol/Motrin as needed.  Advised follow-up if progress or do not resolve.         Relevant Orders    CBC    COMPREHENSIVE METABOLIC PNL, FASTING    LIPID PANEL    MAGNESIUM    VITAMIN D 25 TOTAL    VITAMIN B12       Labs drawn today.  Continue current medications.  Advised a low-fat/low sodium diet, advised 150 minutes of scheduled weekly physical activity as tolerated.  Advised to maintain a healthy weight.     Return in about 3 months (around 05/12/2023) for routine visit.    Stormy Fabian, NP-C     Portions of this note may be dictated using voice recognition software or a dictation service. Variances in spelling and vocabulary are possible and unintentional. Not all errors are caught/corrected. Please notify the There[facility] Parkin if any discrepancies are noted or if the meaning of any statement is not clear.

## 2023-02-09 NOTE — Assessment & Plan Note (Signed)
Continue rosuvastatin 5mg  daily. Advised to limit high fat foods, processed foods and fast food in diet.

## 2023-02-09 NOTE — Assessment & Plan Note (Signed)
Continue vitamin B12 1000 mcg daily.

## 2023-02-09 NOTE — Assessment & Plan Note (Addendum)
BP elevated.  Managed by Cardiology.  Continue current medications.

## 2023-02-09 NOTE — Assessment & Plan Note (Signed)
Symptoms controlled with Claritin 10 mg daily as needed.  Hydrate well.  Advised over-the-counter Tylenol/Motrin as needed.  Advised follow-up if progress or do not resolve.

## 2023-02-09 NOTE — Assessment & Plan Note (Signed)
Compliant with CPAP 

## 2023-02-09 NOTE — Assessment & Plan Note (Signed)
Advise a low fat/low sodium diet, advise 150 min of exercise and to maintain a healthy weight.

## 2023-02-10 ENCOUNTER — Ambulatory Visit (RURAL_HEALTH_CENTER): Payer: Self-pay | Admitting: Family

## 2023-02-15 ENCOUNTER — Encounter (HOSPITAL_COMMUNITY): Payer: Self-pay

## 2023-02-15 ENCOUNTER — Emergency Department
Admission: EM | Admit: 2023-02-15 | Discharge: 2023-02-15 | Disposition: A | Discharge status: Home / Self Care | Payer: Worker's Comp, Other unspecified | Attending: FAMILY PRACTICE | Admitting: FAMILY PRACTICE

## 2023-02-15 ENCOUNTER — Other Ambulatory Visit: Payer: Self-pay

## 2023-02-15 ENCOUNTER — Emergency Department (HOSPITAL_COMMUNITY): Payer: Worker's Comp, Other unspecified

## 2023-02-15 DIAGNOSIS — Y990 Civilian activity done for income or pay: Secondary | ICD-10-CM | POA: Insufficient documentation

## 2023-02-15 DIAGNOSIS — M25561 Pain in right knee: Secondary | ICD-10-CM

## 2023-02-15 DIAGNOSIS — M25562 Pain in left knee: Secondary | ICD-10-CM

## 2023-02-15 DIAGNOSIS — Z042 Encounter for examination and observation following work accident: Secondary | ICD-10-CM | POA: Insufficient documentation

## 2023-02-15 DIAGNOSIS — M19012 Primary osteoarthritis, left shoulder: Secondary | ICD-10-CM | POA: Insufficient documentation

## 2023-02-15 DIAGNOSIS — M7989 Other specified soft tissue disorders: Secondary | ICD-10-CM | POA: Insufficient documentation

## 2023-02-15 DIAGNOSIS — Z96652 Presence of left artificial knee joint: Secondary | ICD-10-CM | POA: Insufficient documentation

## 2023-02-15 DIAGNOSIS — W010XXA Fall on same level from slipping, tripping and stumbling without subsequent striking against object, initial encounter: Secondary | ICD-10-CM | POA: Insufficient documentation

## 2023-02-15 DIAGNOSIS — W19XXXA Unspecified fall, initial encounter: Secondary | ICD-10-CM

## 2023-02-15 DIAGNOSIS — M25512 Pain in left shoulder: Secondary | ICD-10-CM

## 2023-02-15 NOTE — ED Nurses Note (Signed)

## 2023-02-15 NOTE — ED Triage Notes (Signed)
Fall from standing, slipped and fell at work landed on abdomen. Reports left shoulder pain, bilateral knee pain right greater than left.

## 2023-02-15 NOTE — ED Provider Notes (Signed)
[facility]  ED Primary Provider Note  History of Present Illness   Chief Complaint   Patient presents with    Fall     Arrival: The patient arrived by Car  Kelsey Pitts is a 67 y.o. female who had concerns including Fall.  Patient reports she fell last night at work from standing.  Patient states she slipped and fell while doing work duties.  Patient states she landed on her abdomen but there is no abdominal pain.  No ecchymosis around the area.  Patient complains of left shoulder pain, and bilateral knee pain.  Right being greater than left.  Patient states that she has a left total knee arthroplasty but it does not seem to be bothering her.  She is able to bear weight.  No head injury no loss of consciousness.  No neck injury no neck pain.    History Reviewed This Encounter:      Physical Exam   ED Triage Vitals [02/15/23 1031]   BP (Non-Invasive) (!) 168/86   Heart Rate 61   Respiratory Rate 18   Temperature 36.8 C (98.2 F)   SpO2 98 %   Weight 93 kg (205 lb)   Height 1.676 m (5\' 6" )       Constitutional:  67 y.o. female who appears in no distress. Normal color, no cyanosis.   HENT:   Head: Normocephalic and atraumatic.   Mouth/Throat: Oropharynx is clear and moist.   Eyes: EOMI, PERRL   Neck: Trachea midline. Neck supple.  Cardiovascular: RRR, No murmurs, rubs or gallops. Intact distal pulses.  Pulmonary/Chest: BS equal bilaterally. No respiratory distress. No wheezes, rales or chest tenderness.   Abdominal: Bowel sounds present and normal. Abdomen soft, no tenderness, no rebound and no guarding.  Back: No midline spinal tenderness, no paraspinal tenderness, no CVA tenderness.           Musculoskeletal: No edema, tenderness or deformity.    Skin: warm and dry. No rash, erythema, pallor or cyanosis  Psychiatric: normal mood and affect. Behavior is normal.   Neurological: Patient keenly alert and responsive, easily able to raise eyebrows, facial muscles/expressions symmetric,  speaking in fluent sentences, moving all extremities equally and fully, normal gait  Patient Data   Labs Ordered/Reviewed - No data to display  XR KNEE BILATERAL 3 VIEWS EA   Final Result by Edi, Radresults In (09/01 1147)   NO VISIBLE FRACTURE.  SOFT TISSUE SWELLING. IF THERE IS ONGOING CLINICAL SUSPICION FOR FRACTURE, CONSIDER FOLLOWUP IMAGING IN 7-10 DAYS.                Radiologist location ID: [id number]         XR SHOULDER LEFT   Final Result by Edi, Radresults In (09/01 1144)   DEGENERATIVE OSTEOARTHROSIS. NO ACUTE FINDINGS.                Radiologist location ID: [id number]           Medical Decision Making        Medical Decision Making  Given work up, exam, and history low suspicion for intracranial hemorrhage or trauma, carotid or vertebral artery dissection, intrathoracic trauma (pulmonary contusion, blunt cardiac trauma, pneumothorax, hemothorax, cardiac tamponade, rib fractures), intra abdominal trauma (no liver, spleen, or renal lacerations, doubt hollow viscus injury given soft abdomen on repeat exams, no free air seen, consistently normotensive), extremity fracture, extremity dislocation, compartment syndrome.      ED Course as of 02/15/23  1610   Sun Feb 15, 2023   1154 X-ray results were reviewed with patient and her husband in great detail.  Patient wanted to stay off work until Thursday.  Patient has a good working relationship with Dr. Lequita Halt the orthopedist that she can follow up with he does accept worker's comp.               Clinical Impression   Fall (Primary)       Disposition: Discharged

## 2023-02-15 NOTE — ED Nurses Note (Signed)
Patient reports having left shoulder pain and bilateral knee pain. Patient reports losing her balance and falling yesterday. Patient denies hitting her head or having LOC. Patient states that her right knee hurts more than her left knee. Right knee has edema with bruising to bilateral legs.

## 2023-02-26 ENCOUNTER — Other Ambulatory Visit: Payer: Self-pay

## 2023-02-26 ENCOUNTER — Encounter (RURAL_HEALTH_CENTER): Payer: Self-pay | Admitting: Family

## 2023-02-26 ENCOUNTER — Ambulatory Visit (RURAL_HEALTH_CENTER): Payer: Self-pay | Attending: Family | Admitting: Family

## 2023-02-26 VITALS — BP 130/69 | HR 66 | Temp 97.1°F | Resp 18 | Ht 66.0 in | Wt 209.4 lb

## 2023-02-26 DIAGNOSIS — J301 Allergic rhinitis due to pollen: Secondary | ICD-10-CM | POA: Insufficient documentation

## 2023-02-26 DIAGNOSIS — H6992 Unspecified Eustachian tube disorder, left ear: Secondary | ICD-10-CM | POA: Insufficient documentation

## 2023-02-26 LAB — POCT RAPID STREP A: RAPID STREP A (POCT): NEGATIVE

## 2023-02-26 MED ORDER — MOMETASONE 50 MCG/ACTUATION NASAL SPRAY
2.0000 | Freq: Two times a day (BID) | NASAL | 0 refills | Status: AC
Start: 2023-02-26 — End: 2023-03-05

## 2023-02-26 NOTE — Addendum Note (Signed)
Addended by: Ernest Haber on: 02/26/2023 02:22 PM     Modules accepted: Orders

## 2023-02-26 NOTE — Nursing Note (Signed)
Patient here today with complaints of left ear pain. Patient states that' it started Tuesday.

## 2023-02-26 NOTE — Progress Notes (Signed)
FAMILY MEDICINE, [facility] FAMILY MEDICINE [place]  [address]  [place] [address]  Operated by [facility]     Name: Kelsey Pitts MRN:  [id number]   Date of Birth: 04/19/1956 Age: 67 y.o.   Date: 02/26/2023  Time: 14:10     Provider: Asa Lente, FNP    Reason for visit: Ear Pain (Ear Pain )      History of Present Illness:  Kelsey Pitts is a 67 y.o. female presents to the clinic with c/o R ear ache that shooting pain up the R side of her head.   She tells me it happens 2-3 times a day, the last time was this morning.  She is consistently taking her Claritin.   Otherwise, she denies any nasal drainage, cough or fever.      Patient Active Problem List    Diagnosis Date Noted    Environmental and seasonal allergies 02/09/2023    Breast cancer screening by mammogram 07/29/2022    Bradycardia 01/02/2022     Managed by [facility] Cardiology.  Dr. Bailey Pitts, Cardiologist.      Palpitations 01/02/2022    Hyperlipidemia 10/01/2021    B12 deficiency 10/01/2021    Anxiety 10/01/2021    Hypertension, essential 04/15/2018     Managed by [facility] Cardiology.  Dr. Bailey Pitts, Cardiologist.      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   ICD10 Conversion    Clinical diagnosis of severe acute respiratory syndrome coronavirus 2 (SARS-CoV-2) disease     Dizzy spells 03/01/2012    DOE (dyspnea on exertion) 03/01/2012    Family history of ischemic heart disease and other diseases of the circulatory system 03/01/2012    Formatting of this note might be different from the original.   IMO Load 2016 R1.3     Fatigue     Flank pain     History of hypotension 08/12/2022    Hypokaluria     Knee pain     Low back pain     Malaise and fatigue 03/01/2012    Nausea 11/11/2022    Postoperative nausea     Snores 03/01/2012    Treatment     Urinary urgency     UTI (urinary tract infection)     UTI (urinary tract infection) 11/11/2022     Past Surgical History:  Past Surgical History:   Procedure Laterality Date    BILATERAL OOPHORECTOMY N/A 2005    Dr Kelsey Pitts SECTION, CLASSIC N/A 1980    Another one in 56    HX APPENDECTOMY N/A 2005    Dr Kelsey Pitts CHOLECYSTECTOMY  1990    Dr Kelsey Pitts DILATION AND CURETTAGE N/A 2005    Dr Kelsey Pitts HYSTERECTOMY N/A 2005    Dr Kelsey Pitts KNEE REPLACMENT N/A 03/30/2019    Dr Kelsey Pitts     Allergies:  No Known Allergies  Medications:  Current Outpatient Medications   Medication Sig    Calcium Carbonate 500 mg calcium (1,250 mg) Oral Tablet, Chewable every day    ergocalciferol, vitamin D2, (DRISDOL) 1,250 mcg (50,000 unit) Oral Capsule Take 1 Capsule (50,000 Units total) by mouth Every 7 days for 90 days    loratadine (CLARITIN) 10 mg Oral Tablet Take 1 Tablet (10 mg total) by mouth Once a day for 90 days    metoprolol succinate (TOPROL-XL) 25 mg Oral Tablet Sustained Release 24 hr Take 1 Tablet (25 mg total) by mouth Once a day for 90 days    midodrine (PROAMITINE) 5 mg Oral Tablet Take 1 Tablet (5 mg total) by mouth Twice daily    mometasone (NASONEX) 50 mcg/actuation Nasal Spray, Non-Aerosol Administer 2 Sprays into affected nostril(s) Twice daily for 7 days Then decrease to 2 sprays each nostril daily throughout allergy season. Indications: eustashian tube dysfunction    rosuvastatin (CRESTOR) 5 mg Oral Tablet Take 1 Tablet (5 mg total) by mouth Once a day for 90 days     Family History:  Family Medical History:       Problem Relation (Age of Onset)    Atrial fibrillation Sister    Congestive Heart Failure Mother    Diabetes type II Mother    Emphysema Mother    Heart  Attack Father    Heart Disease Mother, Father, Sister    Hypertension (High Blood Pressure) Mother, Father    No Known Problems Brother, Half-Sister, Half-Brother, Maternal Aunt, Maternal Uncle, Paternal Aunt, Paternal Uncle, Maternal Grandmother, Maternal Grandfather, Paternal Grandmother, Paternal Grandfather, Daughter, Son    Pacemaker Sister            Social History:  Social History     Socioeconomic History    Marital status: Married     Spouse name: Kelsey Pitts    Number of children: 3    Highest education level: 12th grade   Occupational History     Comment: [employer]; housekeeping.   Tobacco Use    Smoking status: Never    Smokeless tobacco: Never   Vaping Use    Vaping status: Never Used   Substance and Sexual Activity    Alcohol use: Never    Drug use: Never    Sexual activity: Never     Partners: Male     Comment: LMP: 2005, G2P3A0     Social Determinants of Health     Financial Resource Strain: Low Risk  (11/25/2022)    Financial Resource Strain     SDOH Financial: No   Transportation Needs: Low Risk  (11/25/2022)    Transportation Needs     SDOH Transportation: No   Social Connections: Low Risk  (11/25/2022)    Social Connections     SDOH Social Isolation: 5 or more times a week   Intimate Partner Violence: Low Risk  (11/25/2022)    Intimate Partner Violence     SDOH Domestic Violence: No   Housing Stability: Low Risk  (11/25/2022)    Housing Stability     SDOH Housing Situation: I have housing.     SDOH Housing Worry: No           Review of Systems:  Any pertinent Review of Systems as addressed in the HPI above.    Physical Exam:  Vital Signs:  Vitals:    02/26/23 1325   BP: 130/69   Pulse: 66   Resp: 18   Temp: 36.2 C (97.1 F)  TempSrc: Temporal   SpO2: 98%   Weight: 95 kg (209 lb 6 oz)   Height: 1.676 m (5\' 6" )   BMI: 33.86     Physical Exam  Vitals reviewed.   Constitutional:       Appearance: Normal appearance. She is not ill-appearing.   HENT:      Head: Normocephalic and atraumatic.       Right Ear: Ear canal and external ear normal. Tympanic membrane is not injected or bulging.      Left Ear: Ear canal and external ear normal. Tympanic membrane is bulging. Tympanic membrane is not injected.      Nose: Mucosal edema and rhinorrhea present. No congestion. Rhinorrhea is clear.      Right Sinus: No maxillary sinus tenderness or frontal sinus tenderness.      Left Sinus: No maxillary sinus tenderness or frontal sinus tenderness.      Comments: Positive transillumination  On the left.      Mouth/Throat:      Lips: Pink.      Mouth: Mucous membranes are moist.      Pharynx: Postnasal drip present. No pharyngeal swelling, oropharyngeal exudate or posterior oropharyngeal erythema.   Eyes:      General: Allergic shiner present.      Conjunctiva/sclera: Conjunctivae normal.   Cardiovascular:      Rate and Rhythm: Normal rate.   Pulmonary:      Effort: Pulmonary effort is normal.      Breath sounds: Normal breath sounds.   Abdominal:      Palpations: Abdomen is soft.   Musculoskeletal:      Cervical back: Neck supple.   Lymphadenopathy:      Cervical: No cervical adenopathy.   Skin:     General: Skin is warm.      Coloration: Skin is not cyanotic or pale.   Neurological:      Mental Status: She is alert and oriented to person, place, and time.   Psychiatric:         Behavior: Behavior is cooperative.          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  Will treat with nasal corticosteroid  Discussed assessment findings and the need for this medication.  Educated on the most appropriate way to administer for the most effective results.  Encouraged to continue oral antihistamine.    Orders Placed This Encounter    mometasone (NASONEX) 50 mcg/actuation Nasal Spray, Non-Aerosol         Return if symptoms worsen or fail to improve.    Nneka Blanda L Marili Vader, FNP-C      Portions of this note may be dictated using voice recognition software or a dictation service. Variances in spelling and vocabulary are possible and unintentional. Not all errors are caught/corrected. Please notify the [facility] if any discrepancies are noted or if the meaning of any statement is not clear.

## 2023-04-14 IMAGING — MR MRI KNEE RT W/O CONTRAST
5 series · 40 of 40 positions shown · IV contrast (gadolinium)
Comparison: None available.

﻿EXAM:  [id number]   MRI KNEE RT W/O CONTRAST
INDICATION: 67-year-old sustained trauma due to fall on [DATE]th at work.  Contusion at the right knee.  No prior surgery of the knee.
TECHNIQUE: Multiplanar, multisequential MRI of the right knee was performed without gadolinium contrast.

[Series 5: PD fat-sat · axial · right · 4.0mm · 0.53mm/px · z∈[-60,+70]mm · 8 of 30 slices shown (1 of 3)]
[im 1/30]
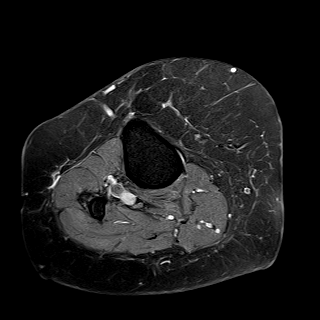
[im 5/30]
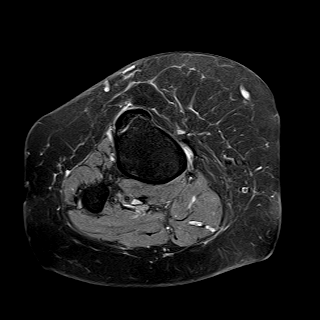
[im 9/30]
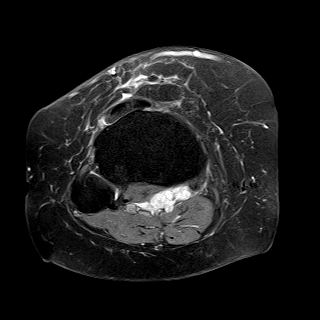
[im 13/30]
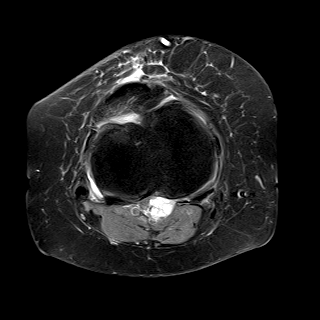
[im 17/30]
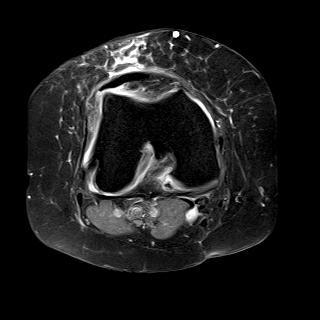
[im 21/30]
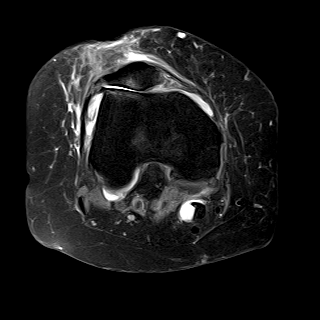
[im 25/30]
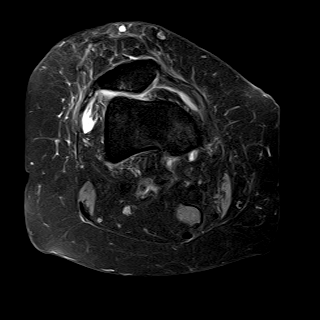
[im 30/30]
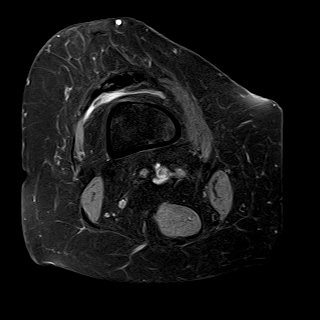

[Series 6: PD fat-sat · sagittal · right · 3.5mm · 0.47mm/px · 8 of 30 slices shown (2 of 3)]
[im 1/30]
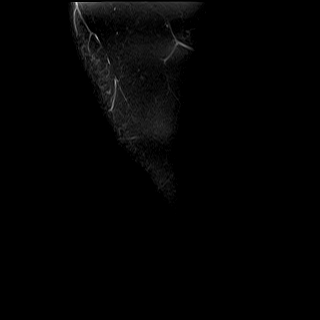
[im 5/30]
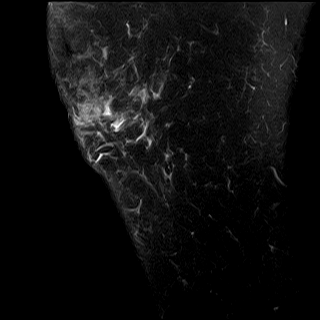
[im 9/30]
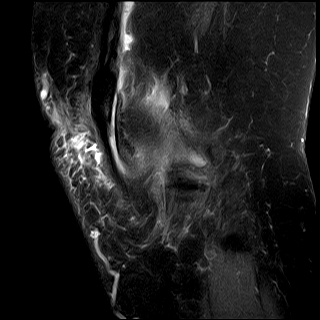
[im 13/30]
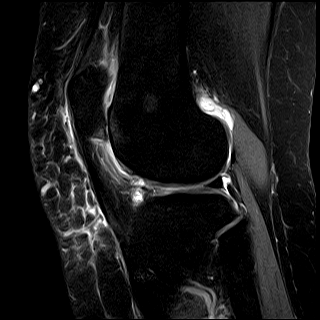
[im 17/30]
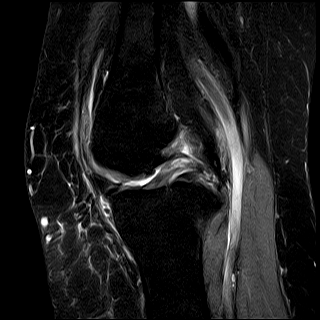
[im 21/30]
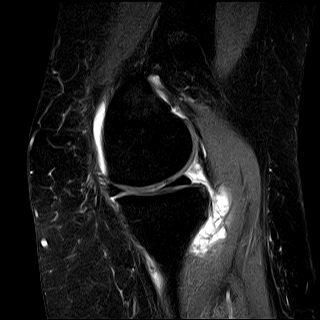
[im 25/30]
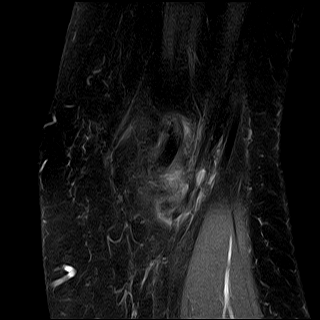
[im 30/30]
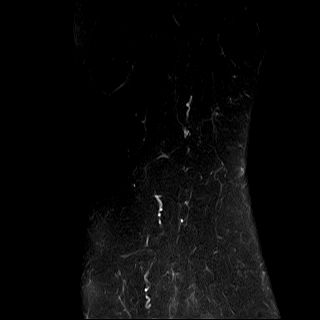

[Series 7: T1 · sagittal · right · 3.5mm · 0.39mm/px · 8 of 30 slices shown]
[im 1/30]
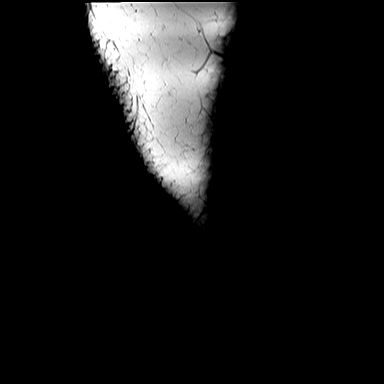
[im 5/30]
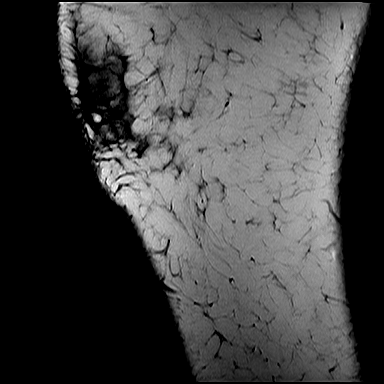
[im 9/30]
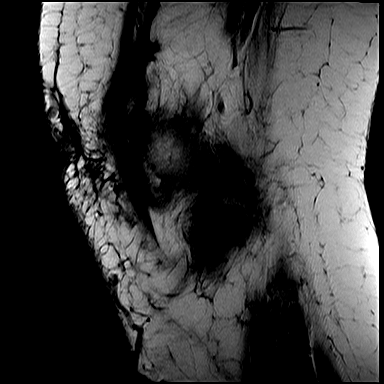
[im 13/30]
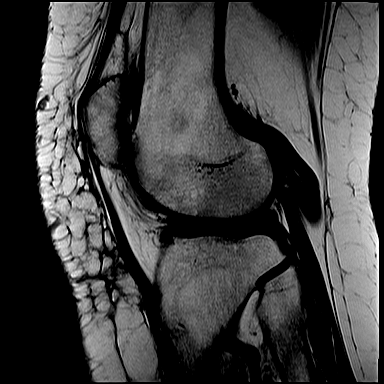
[im 17/30]
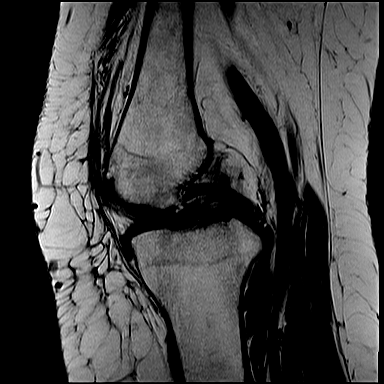
[im 21/30]
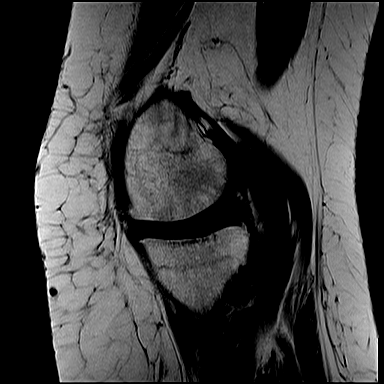
[im 25/30]
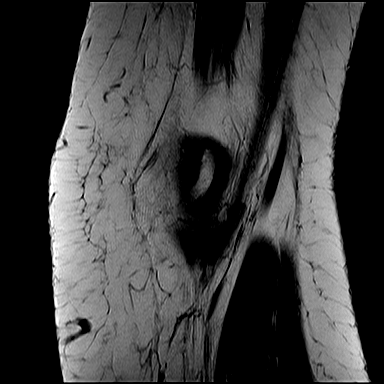
[im 30/30]
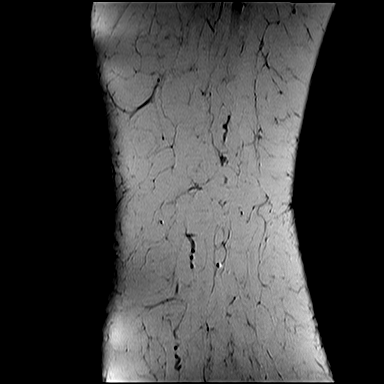

[Series 8: STIR · coronal · right · 3.5mm · 0.47mm/px · 8 of 31 slices shown]
[im 1/31]
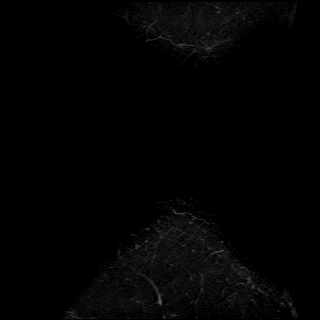
[im 5/31]
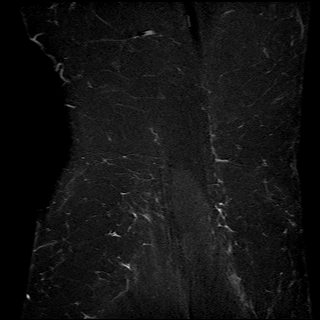
[im 9/31]
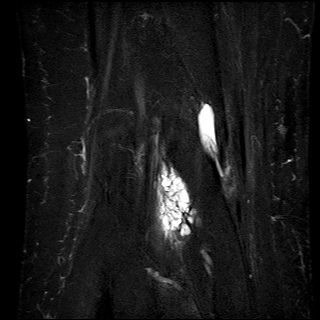
[im 13/31]
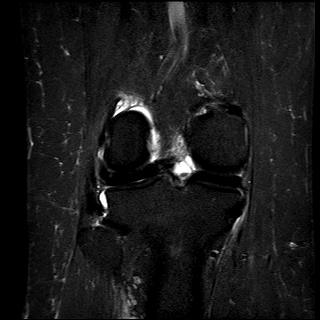
[im 18/31]
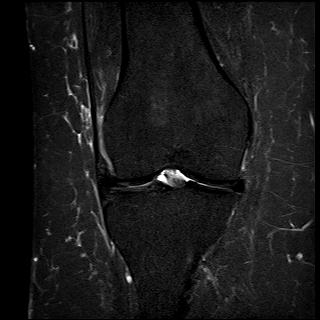
[im 22/31]
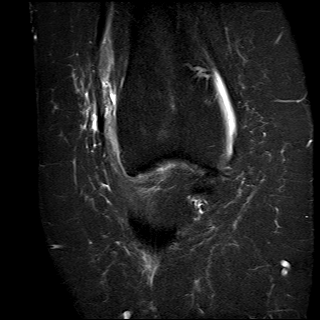
[im 26/31]
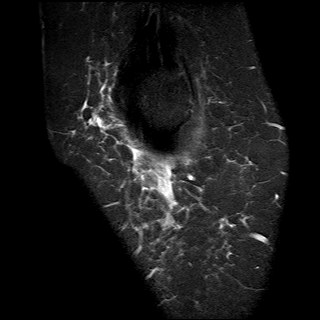
[im 31/31]
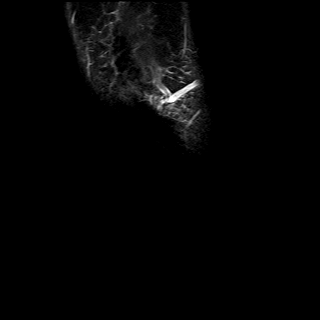

[Series 9: PD fat-sat · coronal · right · 3.5mm · 0.47mm/px · 8 of 31 slices shown (3 of 3)]
[im 1/31]
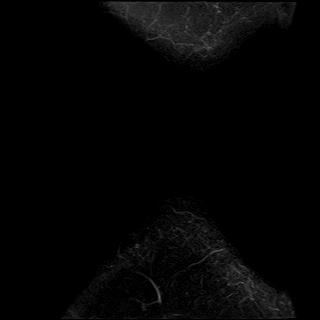
[im 5/31]
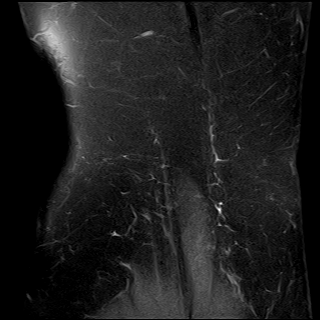
[im 9/31]
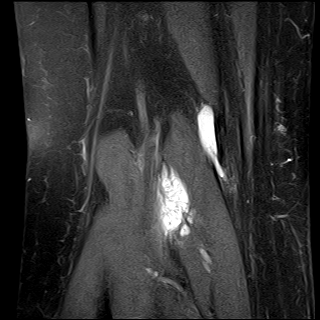
[im 13/31]
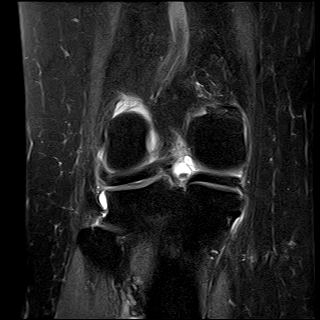
[im 18/31]
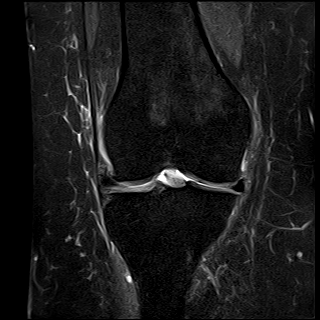
[im 22/31]
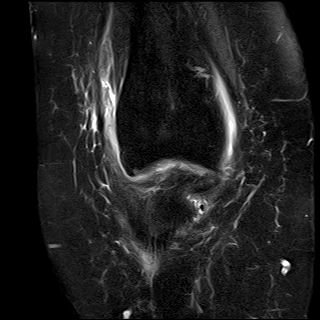
[im 26/31]
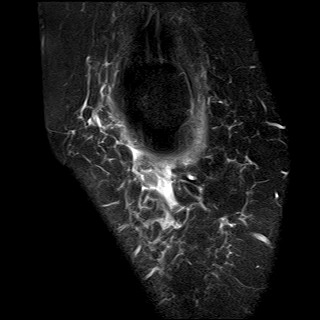
[im 31/31]
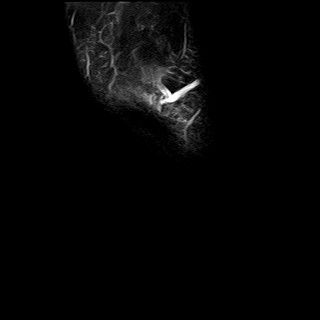

[40 of 40 positions shown; findings below may reference images not displayed]

FINDINGS: No acute fracture or bone bruise is seen at the right knee.

Severe grade 4 degenerative changes of patellofemoral articulation with patella alta are noted, especially of the lateral facet with subarticular bone marrow edema of the patella.  Quadriceps tendon and patellar tendon are intact. Significant infrapatellar bursitis is noted. 

Significant deformity of the anterior midportion of the lateral meniscus is noted suggestive of chronic tear and degenerative changes, in the absence of prior surgery.  No displaced fragments are seen. Mild lateral extrusion of mid lateral meniscus is noted.

The anterior and posterior cruciate ligaments are intact.

Medial meniscus shows degenerative changes of the posterior horn with suggestion of a large, multiloculated parameniscal cyst from the posterior horn of the medial meniscus extending into the popliteal fossa.  This multilocular cyst measures 3 x 1.2 cm in sagittal dimensions.  No displaced meniscal tear is seen.  Small effusion in the knee joint and small Baker's cyst in the medial aspect of the popliteal fossa are noted.
IMPRESSION: 1. Severe grade 4 degenerative changes of patellofemoral articulation with patella alta are noted, especially of the lateral facet with subarticular bone marrow edema of the patella.  Quadriceps tendon and patellar tendon are intact. Significant infrapatellar bursitis is noted. 

2. Significant deformity of the anterior midportion of the lateral meniscus is noted suggestive of chronic tear and degenerative changes, in the absence of prior surgery.  No displaced fragments are seen. Mild lateral extrusion of mid lateral meniscus is noted.

3. The Medial meniscus shows degenerative changes of the posterior horn with suggestion of a large, multiloculated parameniscal cyst from the posterior horn of the medial meniscus extending into the popliteal fossa.  This multilocular cyst measures 3 x 1.2 cm in sagittal dimensions.  No displaced meniscal tear is seen.

4. Small effusion in the knee joint along with a small Baker's cyst in the popliteal fossa.

## 2023-04-17 DIAGNOSIS — N2889 Other specified disorders of kidney and ureter: Secondary | ICD-10-CM

## 2023-04-17 DIAGNOSIS — R911 Solitary pulmonary nodule: Secondary | ICD-10-CM

## 2023-04-17 HISTORY — DX: Solitary pulmonary nodule: R91.1

## 2023-04-17 HISTORY — DX: Other specified disorders of kidney and ureter: N28.89

## 2023-05-12 ENCOUNTER — Ambulatory Visit (RURAL_HEALTH_CENTER): Payer: Self-pay | Admitting: Family

## 2023-05-21 ENCOUNTER — Ambulatory Visit (RURAL_HEALTH_CENTER): Payer: Self-pay | Attending: Family | Admitting: Family

## 2023-05-21 ENCOUNTER — Encounter (RURAL_HEALTH_CENTER): Payer: Self-pay | Admitting: Family

## 2023-05-21 ENCOUNTER — Other Ambulatory Visit: Payer: Self-pay

## 2023-05-21 ENCOUNTER — Ambulatory Visit: Payer: Self-pay | Attending: Family | Admitting: Family

## 2023-05-21 ENCOUNTER — Telehealth (RURAL_HEALTH_CENTER): Payer: Self-pay | Admitting: Family

## 2023-05-21 VITALS — BP 140/88 | HR 70 | Temp 98.0°F | Wt 221.0 lb

## 2023-05-21 DIAGNOSIS — G4730 Sleep apnea, unspecified: Secondary | ICD-10-CM | POA: Insufficient documentation

## 2023-05-21 DIAGNOSIS — M2550 Pain in unspecified joint: Secondary | ICD-10-CM | POA: Insufficient documentation

## 2023-05-21 DIAGNOSIS — E669 Obesity, unspecified: Secondary | ICD-10-CM | POA: Insufficient documentation

## 2023-05-21 DIAGNOSIS — I10 Essential (primary) hypertension: Secondary | ICD-10-CM | POA: Insufficient documentation

## 2023-05-21 DIAGNOSIS — E538 Deficiency of other specified B group vitamins: Secondary | ICD-10-CM | POA: Insufficient documentation

## 2023-05-21 DIAGNOSIS — E559 Vitamin D deficiency, unspecified: Secondary | ICD-10-CM | POA: Insufficient documentation

## 2023-05-21 DIAGNOSIS — E785 Hyperlipidemia, unspecified: Secondary | ICD-10-CM | POA: Insufficient documentation

## 2023-05-21 LAB — LIPID PANEL
CHOL/HDL RATIO: 2.1
CHOLESTEROL: 181 mg/dL (ref <200)
HDL CHOL: 85 mg/dL (ref >=40)
LDL CALC: 84 mg/dL (ref 0–100)
TRIGLYCERIDES: 59 mg/dL (ref <=150)
VLDL CALC: 12 mg/dL (ref 0–50)

## 2023-05-21 LAB — CBC
HCT: 40.3 % (ref 31.2–41.9)
HGB: 13.5 g/dL (ref 10.9–14.3)
MCH: 29.7 pg (ref 24.7–32.8)
MCHC: 33.6 g/dL (ref 32.3–35.6)
MCV: 88.4 fL (ref 75.5–95.3)
MPV: 8.4 fL (ref 7.9–10.8)
PLATELETS: 227 10*3/uL (ref 140–440)
RBC: 4.56 10*6/uL (ref 3.63–4.92)
RDW: 12.7 % (ref 12.3–17.7)
WBC: 6.4 10*3/uL (ref 3.8–11.8)

## 2023-05-21 LAB — COMPREHENSIVE METABOLIC PNL, FASTING
ALBUMIN/GLOBULIN RATIO: 1.7 — ABNORMAL HIGH (ref 0.8–1.4)
ALBUMIN: 4.4 g/dL (ref 3.5–5.7)
ALKALINE PHOSPHATASE: 56 U/L (ref 34–104)
ALT (SGPT): 18 U/L (ref 7–52)
ANION GAP: 6 mmol/L (ref 4–13)
AST (SGOT): 18 U/L (ref 13–39)
BILIRUBIN TOTAL: 0.7 mg/dL (ref 0.3–1.0)
BUN/CREA RATIO: 33 — ABNORMAL HIGH (ref 6–22)
BUN: 21 mg/dL (ref 7–25)
CALCIUM, CORRECTED: 9.3 mg/dL (ref 8.9–10.8)
CALCIUM: 9.6 mg/dL (ref 8.6–10.3)
CHLORIDE: 105 mmol/L (ref 98–107)
CO2 TOTAL: 29 mmol/L (ref 21–31)
CREATININE: 0.63 mg/dL (ref 0.60–1.30)
ESTIMATED GFR: 98 mL/min/{1.73_m2} (ref >59)
GLOBULIN: 2.6 (ref 2.0–3.5)
GLUCOSE: 106 mg/dL (ref 74–109)
OSMOLALITY, CALCULATED: 283 mosm/kg (ref 270–290)
POTASSIUM: 3.9 mmol/L (ref 3.5–5.1)
PROTEIN TOTAL: 7 g/dL (ref 6.4–8.9)
SODIUM: 140 mmol/L (ref 136–145)

## 2023-05-21 LAB — VITAMIN D 25 TOTAL: VITAMIN D 25, TOTAL: 30.83 ng/mL (ref 30.00–100.00)

## 2023-05-21 LAB — VITAMIN B12: VITAMIN B 12: 951 pg/mL — ABNORMAL HIGH (ref 180–914)

## 2023-05-21 LAB — THYROID STIMULATING HORMONE (SENSITIVE TSH): TSH: 3.577 u[IU]/mL (ref 0.450–5.330)

## 2023-05-21 LAB — MAGNESIUM: MAGNESIUM: 2.3 mg/dL (ref 1.9–2.7)

## 2023-05-21 MED ORDER — METOPROLOL SUCCINATE ER 25 MG TABLET,EXTENDED RELEASE 24 HR
25.0000 mg | ORAL_TABLET | Freq: Every day | ORAL | 1 refills | Status: DC
Start: 2023-05-21 — End: 2023-08-19

## 2023-05-21 MED ORDER — LORATADINE 10 MG TABLET
10.0000 mg | ORAL_TABLET | Freq: Every day | ORAL | 1 refills | Status: DC
Start: 2023-05-21 — End: 2023-08-19

## 2023-05-21 MED ORDER — ERGOCALCIFEROL (VITAMIN D2) 1,250 MCG (50,000 UNIT) CAPSULE
50000.0000 [IU] | ORAL_CAPSULE | ORAL | 1 refills | Status: DC
Start: 2023-05-21 — End: 2023-08-19

## 2023-05-21 MED ORDER — ROSUVASTATIN 5 MG TABLET
5.0000 mg | ORAL_TABLET | Freq: Every day | ORAL | 1 refills | Status: DC
Start: 2023-05-21 — End: 2023-08-19

## 2023-05-21 MED ORDER — TRIAMCINOLONE ACETONIDE 40 MG/ML SUSPENSION FOR INJECTION
40.0000 mg | INTRAMUSCULAR | Status: AC
Start: 2023-05-21 — End: 2023-05-21
  Administered 2023-05-21: 40 mg via INTRAMUSCULAR

## 2023-05-21 NOTE — Assessment & Plan Note (Signed)
-

## 2023-05-21 NOTE — Assessment & Plan Note (Signed)
Kenalog 40 mg IM given today.  Strongly advised patient to do stretching exercises daily.  Patient declined physical therapy at this time.

## 2023-05-21 NOTE — Assessment & Plan Note (Signed)
Continue rosuvastatin 5mg  daily. Advised to limit high fat foods, processed foods and fast food in diet.

## 2023-05-21 NOTE — Assessment & Plan Note (Signed)
Continue weekly vitamin D 50, 000 IU

## 2023-05-21 NOTE — Assessment & Plan Note (Signed)
Advise a low fat/low sodium diet, advise 150 min of exercise and to maintain a healthy weight.

## 2023-05-21 NOTE — Nursing Note (Signed)
Patient here for follow up

## 2023-05-21 NOTE — Assessment & Plan Note (Signed)
BP elevated.  Managed by Cardiology at [facility].  Patient reports she is currently undergoing testing and blood pressure medications.  Patient has a repeat chest CT on December 28th.  Denies chest pain, palpitations or shortness for breath today.  Continue current medications.

## 2023-05-21 NOTE — Progress Notes (Signed)
FAMILY MEDICINE, [facility] FAMILY MEDICINE [facility]  [address]  [place] [address]  Operated by [facility]     Name: Kelsey Pitts MRN:  [id number]   Date of Birth: 1956-05-26 Age: 67 y.o.   Date: 05/21/2023  Time: 12:58     Provider: Stormy Fabian, NP-C    Reason for visit: Follow Up 3 Months (Follow up)      History of Present Illness:  Kelsey Pitts is a 67 y.o. female presenting with chronic disease management.      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  Patient reports she is having low back discomfort.  Denies radiating pain.  Patient complains of multiple joints are achy and sore muscles.  Denies numbness or tingling.    Patient reports she is under the care of [facility] cardiology.  Patient reports she was experiencing some shortness a breath and her cardiologist ordered a chest CT which indicated she has 2 lung nodules.  Patient reports she has a repeat chest CT on December 28th, 2024.  Denies fever, cough, nausea, vomiting or diarrhea.  Denies chest pain, palpitations or shortness for breath.  Denies recent hospitalizations or surgeries.      Patient Active Problem List    Diagnosis Date Noted    Joint pain 05/21/2023    Environmental and seasonal allergies 02/09/2023    Breast cancer screening by mammogram 07/29/2022    Hyperlipidemia 10/01/2021    B12 deficiency 10/01/2021    Hypertension, essential 04/15/2018     Managed by [facility] Cardiology.  Dr. Bailey Mech, Cardiologist.      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  Dr. Bailey Mech, Cardiologist.      Chest discomfort     Chest pain 03/01/2012    Formatting of this note might be different from the original.   ICD10 Conversion    Clinical diagnosis of severe acute respiratory syndrome coronavirus 2 (SARS-CoV-2) disease     Dizzy spells 03/01/2012    DOE (dyspnea on exertion) 03/01/2012    Family history of ischemic heart disease and other diseases of the circulatory system 03/01/2012    Formatting of this note might be different from the original.   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    Knee pain     Low back pain     Lung nodule 04/2023    Malaise and fatigue 03/01/2012    Nausea 11/11/2022    Palpitations 01/02/2022    Postoperative nausea     Snores 03/01/2012    Treatment     Urinary urgency     UTI (urinary tract infection)     UTI (urinary tract infection) 11/11/2022     Past Surgical History:  Past Surgical History:   Procedure Laterality Date  BILATERAL OOPHORECTOMY N/A 2005    Dr Jaci Standard    CESAREAN SECTION, CLASSIC N/A 1980    Another one in 69    HX APPENDECTOMY N/A 2005    Dr Lincoln Brigham CHOLECYSTECTOMY  1990    Dr Arleta Creek DILATION AND CURETTAGE N/A 2005    Dr Lendon Collar HYSTERECTOMY N/A 2005    Dr Lendon Collar KNEE REPLACMENT N/A 03/30/2019    Dr Lequita Halt     Allergies:  No Known Allergies  Medications:  Current Outpatient Medications   Medication Sig    Calcium Carbonate 500 mg calcium (1,250 mg) Oral Tablet, Chewable every day    ergocalciferol, vitamin D2, (DRISDOL) 1,250 mcg (50,000 unit) Oral Capsule Take 1 Capsule (50,000 Units total) by mouth Every 7 days for 90 days    loratadine (CLARITIN) 10 mg Oral Tablet Take 1 Tablet (10 mg total) by mouth Once a day for 90 days    metoprolol succinate (TOPROL-XL) 25 mg Oral Tablet Sustained Release 24 hr Take 1 Tablet (25 mg total) by mouth Once a day for 90 days    midodrine (PROAMITINE) 5 mg Oral Tablet Take 1  Tablet (5 mg total) by mouth Twice daily    rosuvastatin (CRESTOR) 5 mg Oral Tablet Take 1 Tablet (5 mg total) by mouth Once a day for 90 days     Family History:  Family Medical History:       Problem Relation (Age of Onset)    Atrial fibrillation Sister    Congestive Heart Failure Mother    Diabetes type II Mother    Emphysema Mother    Heart Attack Father    Heart Disease Mother, Father, Sister    Hypertension (High Blood Pressure) Mother, Father    No Known Problems Brother, Half-Sister, Half-Brother, Maternal Aunt, Maternal Uncle, Paternal Aunt, Paternal Uncle, Maternal Grandmother, Maternal Grandfather, Paternal Grandmother, Paternal Grandfather, Daughter, Son    Pacemaker Sister            Social History:  Social History     Socioeconomic History    Marital status: Married     Spouse name: Artemisa Mustafa    Number of children: 3    Highest education level: 12th grade   Occupational History     Comment: [employer]; housekeeping.   Tobacco Use    Smoking status: Never    Smokeless tobacco: Never   Vaping Use    Vaping status: Never Used   Substance and Sexual Activity    Alcohol use: Never    Drug use: Never    Sexual activity: Never     Partners: Male     Comment: LMP: 2005, [id number]     Social Determinants of Health     Financial Resource Strain: Low Risk  (11/25/2022)    Financial Resource Strain     SDOH Financial: No   Transportation Needs: Low Risk  (11/25/2022)    Transportation Needs     SDOH Transportation: No   Social Connections: Low Risk  (11/25/2022)    Social Connections     SDOH Social Isolation: 5 or more times a week   Intimate Partner Violence: Low Risk  (11/25/2022)    Intimate Partner Violence     SDOH Domestic Violence: No   Housing Stability: Low Risk  (11/25/2022)    Housing Stability     SDOH Housing Situation: I have housing.     SDOH  Housing Worry: No           Review of Systems:  Any pertinent Review of Systems as addressed in the HPI above.    Physical Exam:  Vital Signs:  Vitals:     05/21/23 1028   BP: (!) 140/88   Pulse: 70   Temp: 36.7 C (98 F)   SpO2: 97%   Weight: 100 kg (221 lb)     Physical Exam  Constitutional:       Appearance: She is obese.   HENT:      Head: Normocephalic.   Cardiovascular:      Rate and Rhythm: Normal rate and regular rhythm.      Pulses: Normal pulses.      Heart sounds: Normal heart sounds, S1 normal and S2 normal.   Pulmonary:      Effort: Pulmonary effort is normal.      Breath sounds: Normal breath sounds.   Abdominal:      General: Bowel sounds are normal.      Palpations: Abdomen is soft.   Musculoskeletal:         General: Normal range of motion.      Cervical back: Neck supple.      Thoracic back: Tenderness present.      Lumbar back: Spasms and tenderness present.      Right lower leg: No edema.      Left lower leg: No edema.      Comments: Generalized muscle tenderness.   Skin:     General: Skin is warm.   Neurological:      General: No focal deficit present.      Mental Status: She is alert and oriented to person, place, and time. Mental status is at baseline.      Sensory: Sensation is intact.      Motor: Motor function is intact.      Coordination: Coordination is intact.      Gait: Gait is intact.   Psychiatric:         Mood and Affect: Mood normal.         Behavior: Behavior normal. Behavior is cooperative.         Thought Content: Thought content normal.         Cognition and Memory: Cognition normal.         Judgment: Judgment normal.          Assessment/Plan:  (I10) Hypertension, essential  (primary encounter diagnosis)  Plan: CBC, COMPREHENSIVE METABOLIC PNL, FASTING,         LIPID PANEL, MAGNESIUM, THYROID STIMULATING         HORMONE (SENSITIVE TSH), VITAMIN D 25 TOTAL,         VITAMIN B12    (E78.5) Hyperlipidemia  Plan: CBC, COMPREHENSIVE METABOLIC PNL, FASTING,         LIPID PANEL, MAGNESIUM, THYROID STIMULATING         HORMONE (SENSITIVE TSH), VITAMIN D 25 TOTAL,         VITAMIN B12    (G47.30) Sleep apnea  Plan: CBC, COMPREHENSIVE  METABOLIC PNL, FASTING,         LIPID PANEL, MAGNESIUM, THYROID STIMULATING         HORMONE (SENSITIVE TSH), VITAMIN D 25 TOTAL,         VITAMIN B12    (E53.8) B12 deficiency  Plan: CBC, COMPREHENSIVE METABOLIC PNL, FASTING,         LIPID PANEL, MAGNESIUM, THYROID STIMULATING  HORMONE (SENSITIVE TSH), VITAMIN D 25 TOTAL,         VITAMIN B12    (E55.9) Vitamin D deficiency  Plan: CBC, COMPREHENSIVE METABOLIC PNL, FASTING,         LIPID PANEL, MAGNESIUM, THYROID STIMULATING         HORMONE (SENSITIVE TSH), VITAMIN D 25 TOTAL,         VITAMIN B12    (E66.9) Obesity  Plan: CBC, COMPREHENSIVE METABOLIC PNL, FASTING,         LIPID PANEL, MAGNESIUM, THYROID STIMULATING         HORMONE (SENSITIVE TSH), VITAMIN D 25 TOTAL,         VITAMIN B12    (M25.50) Joint pain      Problem List Items Addressed This Visit          Cardiovascular System    Hypertension, essential - Primary     BP elevated.  Managed by Cardiology at [facility].  Patient reports she is currently undergoing testing and blood pressure medications.  Patient has a repeat chest CT on December 28th.  Denies chest pain, palpitations or shortness for breath today.  Continue current medications.         Relevant Orders    CBC    COMPREHENSIVE METABOLIC PNL, FASTING    LIPID PANEL    MAGNESIUM    THYROID STIMULATING HORMONE (SENSITIVE TSH)    VITAMIN D 25 TOTAL    VITAMIN B12    Hyperlipidemia     Continue rosuvastatin 5mg  daily. Advised to limit high fat foods, processed foods and fast food in diet.          Relevant Orders    CBC    COMPREHENSIVE METABOLIC PNL, FASTING    LIPID PANEL    MAGNESIUM    THYROID STIMULATING HORMONE (SENSITIVE TSH)    VITAMIN D 25 TOTAL    VITAMIN B12       Respiratory    Sleep apnea     Compliant with CPAP.         Relevant Orders    CBC    COMPREHENSIVE METABOLIC PNL, FASTING    LIPID PANEL    MAGNESIUM    THYROID STIMULATING HORMONE (SENSITIVE TSH)    VITAMIN D 25 TOTAL    VITAMIN B12       Neurologic    Joint pain      Kenalog 40 mg IM given today.  Strongly advised patient to do stretching exercises daily.  Patient declined physical therapy at this time.            Endocrine    Vitamin D deficiency     Continue weekly vitamin D 50,000 IU         Relevant Orders    CBC    COMPREHENSIVE METABOLIC PNL, FASTING    LIPID PANEL    MAGNESIUM    THYROID STIMULATING HORMONE (SENSITIVE TSH)    VITAMIN D 25 TOTAL    VITAMIN B12    B12 deficiency     Continue vitamin B12 daily.         Relevant Orders    CBC    COMPREHENSIVE METABOLIC PNL, FASTING    LIPID PANEL    MAGNESIUM    THYROID STIMULATING HORMONE (SENSITIVE TSH)    VITAMIN D 25 TOTAL    VITAMIN B12       Other    Obesity     Advise a low fat/low sodium  diet, advise 150 min of exercise and to maintain a healthy weight.          Relevant Orders    CBC    COMPREHENSIVE METABOLIC PNL, FASTING    LIPID PANEL    MAGNESIUM    THYROID STIMULATING HORMONE (SENSITIVE TSH)    VITAMIN D 25 TOTAL    VITAMIN B12       Labs drawn today.  Continue current medications.  Advised to follow up with specialist as scheduled.  Advised a low-fat/low sodium diet, advised 150 minutes of scheduled weekly physical activity as tolerated.  Advised to maintain a healthy weight.     Return in about 1 month (around 06/21/2023) for 1 month to recheck BP; 3 months routine visit.    Stormy Fabian, NP-C     Portions of this note may be dictated using voice recognition software or a dictation service. Variances in spelling and vocabulary are possible and unintentional. Not all errors are caught/corrected. Please notify the [facility] Park[facility] if any discrepancies are noted or if the meaning of any statement is not clear.

## 2023-05-21 NOTE — Assessment & Plan Note (Signed)
Compliant with CPAP 

## 2023-06-03 NOTE — Telephone Encounter (Signed)
Requested last office note.

## 2023-06-23 ENCOUNTER — Ambulatory Visit (RURAL_HEALTH_CENTER): Payer: Self-pay

## 2023-07-30 ENCOUNTER — Ambulatory Visit (RURAL_HEALTH_CENTER): Payer: Self-pay | Admitting: Family

## 2023-08-05 ENCOUNTER — Ambulatory Visit (RURAL_HEALTH_CENTER): Payer: Self-pay | Admitting: Family

## 2023-08-10 ENCOUNTER — Encounter (RURAL_HEALTH_CENTER): Payer: Self-pay | Admitting: Family

## 2023-08-10 ENCOUNTER — Ambulatory Visit (RURAL_HEALTH_CENTER): Payer: No Typology Code available for payment source | Attending: Family | Admitting: Family

## 2023-08-10 ENCOUNTER — Other Ambulatory Visit: Payer: Self-pay

## 2023-08-10 VITALS — BP 136/74 | HR 55 | Temp 97.8°F | Wt 216.0 lb

## 2023-08-10 DIAGNOSIS — Z0000 Encounter for general adult medical examination without abnormal findings: Secondary | ICD-10-CM | POA: Insufficient documentation

## 2023-08-10 DIAGNOSIS — Z131 Encounter for screening for diabetes mellitus: Secondary | ICD-10-CM | POA: Insufficient documentation

## 2023-08-10 DIAGNOSIS — Z13820 Encounter for screening for osteoporosis: Secondary | ICD-10-CM | POA: Insufficient documentation

## 2023-08-10 DIAGNOSIS — Z1231 Encounter for screening mammogram for malignant neoplasm of breast: Secondary | ICD-10-CM

## 2023-08-10 DIAGNOSIS — Z1211 Encounter for screening for malignant neoplasm of colon: Secondary | ICD-10-CM | POA: Insufficient documentation

## 2023-08-10 NOTE — Progress Notes (Signed)
 [facility] MEDICINE [facility]  [facility] FAMILY MEDICINE      Kelsey Pitts  MRN: [id number]  DOB: 02/28/1956  Date of Service: 08/10/2023    CHIEF COMPLAINT  Chief Complaint   Patient presents with    Well Check Adult       HISTORY OF PRESENT ILLNESS  Kelsey Pitts is a 68 y.o. female presenting to clinic to Adult Wellness Visit.     Patient Active Problem List    Diagnosis Date Noted    Adult wellness visit 08/10/2023    Diabetes mellitus screening 08/10/2023    Colon cancer screening 08/10/2023    Osteoporosis screening 08/10/2023    Joint pain 05/21/2023    Environmental and seasonal allergies 02/09/2023    Breast cancer screening by mammogram 07/29/2022    Hyperlipidemia 10/01/2021    B12 deficiency 10/01/2021    Hypertension, essential 04/15/2018     Managed by [facility].  Dr. Bailey Pitts, Cardiologist.      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  Dr. Bailey Pitts, Cardiologist.      Chest discomfort     Chest pain 03/01/2012    Formatting of this note might be different from the original.   ICD10 Conversion    Clinical diagnosis of severe acute respiratory syndrome coronavirus 2 (SARS-CoV-2) disease     Dizzy spells 03/01/2012    DOE (dyspnea on exertion) 03/01/2012    Family history of ischemic heart disease and other diseases of the circulatory system 03/01/2012    Formatting of this note might be different from the original.   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    Knee pain     Low back pain     Lung nodule 04/2023    Malaise and fatigue 03/01/2012    Nausea  11/11/2022    Palpitations 01/02/2022    Postoperative nausea     Snores 03/01/2012    Treatment     Urinary urgency     UTI (urinary tract infection)     UTI (urinary tract infection) 11/11/2022        MEDICATIONS  Calcium Carbonate 500 mg calcium (1,250 mg) Oral Tablet, Chewable, every day  ergocalciferol, vitamin D2, (DRISDOL) 1,250 mcg (50,000 unit) Oral Capsule, Take 1 Capsule (50,000 Units total) by mouth Every 7 days for 90 days  loratadine (CLARITIN) 10 mg Oral Tablet, Take 1 Tablet (10 mg total) by mouth Once a day for 90 days  metoprolol succinate (TOPROL-XL) 25 mg Oral Tablet Sustained Release 24 hr, Take 1 Tablet (25 mg total) by mouth Once a day for 90 days  midodrine (PROAMITINE) 5 mg Oral Tablet, Take 1 Tablet (5 mg total) by mouth Twice daily  rosuvastatin (CRESTOR) 5 mg Oral Tablet, Take 1 Tablet (5 mg total) by mouth Once a day for 90 days    No facility-administered medications prior to visit.      ALLERGIES  No  Known Allergies    PAST SURGICAL HISTORY  Past Surgical History:   Procedure Laterality Date    BILATERAL OOPHORECTOMY N/A 2005    Dr Kelsey Pitts SECTION, CLASSIC N/A 1980    Another one in 63    HX APPENDECTOMY N/A 2005    Dr Kelsey Pitts CHOLECYSTECTOMY  1990    Dr Kelsey Pitts DILATION AND CURETTAGE N/A 2005    Dr Kelsey Pitts HYSTERECTOMY N/A 2005    Dr Kelsey Pitts KNEE REPLACMENT N/A 03/30/2019    Dr Kelsey Pitts  Immunization History   Administered Date(s) Administered    AREXVY (ADULT RSV) 01/10/2023    Covid-19 Vaccine,Pfizer-BioNTech,Purple Top,57yrs+ 06/22/2019, 07/13/2019, 03/21/2020, 04/17/2021    Flucelvax Influenza Vaccine, 6 months + 02/11/2018    High-Dose Influenza Vaccine, 65+ 03/16/2022, 03/15/2023    Influenza Vaccine, 6 month-adult 03/26/2016    PREVNAR 20 03/16/2022    Shingrix - Zoster Vaccine 11/21/2017, 02/11/2018    Tetanus Toxoid/Diphtheria Toxoid/Acellular Pertussis Vaccine, Adsorbed 11/21/2017       FAMILY HISTORY  Family Medical  History:       Problem Relation (Age of Onset)    Atrial fibrillation Sister    Congestive Heart Failure Mother    Diabetes type II Mother    Emphysema Mother    Heart Attack Father    Heart Disease Mother, Father, Sister    Hypertension (High Blood Pressure) Mother, Father    No Known Problems Brother, Half-Sister, Half-Brother, Maternal Aunt, Maternal Uncle, Paternal Aunt, Paternal Uncle, Maternal Grandmother, Maternal Grandfather, Paternal Grandmother, Paternal Grandfather, Daughter, Son    Pacemaker Sister              SOCIAL HISTORY  Social History     Socioeconomic History    Marital status: Married     Spouse name: Kelsey Pitts    Number of children: 3    Highest education level: 12th grade   Occupational History     Comment: [employer]; housekeeping.   Tobacco Use    Smoking status: Never    Smokeless tobacco: Never   Vaping Use    Vaping status: Never Used   Substance and Sexual Activity    Alcohol use: Never    Drug use: Never    Sexual activity: Never     Partners: Male     Comment: LMP: 2005, [id number]     Social Determinants of Health     Financial Resource Strain: Low Risk  (11/25/2022)    Financial Resource Strain     SDOH Financial: No   Transportation Needs: Low Risk  (11/25/2022)    Transportation Needs     SDOH Transportation: No   Social Connections: Low Risk  (11/25/2022)    Social Connections     SDOH Social Isolation: 5 or more times a week   Intimate Partner Violence: Low Risk  (11/25/2022)    Intimate Partner Violence     SDOH Domestic Violence: No   Housing Stability: Low Risk  (11/25/2022)    Housing Stability     SDOH Housing Situation: I have housing.     SDOH Housing Worry: No       REVIEW OF SYSTEMS  Positive ROS discussed in HPI, otherwise all other systems negative.      PHYSICAL EXAM  Vitals: Blood pressure 136/74, pulse 55, temperature 36.6 C (97.8 F), weight 98 kg (216  lb), SpO2 98%. Body mass index is 34.86 kg/m.  Vision screening:  Right eye 20/20, left eye 20/20;  corrected.    Physical Exam  Constitutional:       Appearance: She is obese.   HENT:      Head: Normocephalic.      Right Ear: Tympanic membrane and ear canal normal.      Left Ear: Tympanic membrane, ear canal and external ear normal.      Nose: Nose normal.      Mouth/Throat:      Mouth: Mucous membranes are moist.   Eyes:      Conjunctiva/sclera: Conjunctivae normal.      Pupils: Pupils are equal, round, and reactive to light.   Cardiovascular:      Rate and Rhythm: Normal rate and regular rhythm.      Pulses: Normal pulses.   Pulmonary:      Effort: Pulmonary effort is normal.   Chest:      Comments: deferred  Abdominal:      General: Bowel sounds are normal.      Palpations: Abdomen is soft.   Genitourinary:     Comments: deferred  Musculoskeletal:      Cervical back: Neck supple.   Lymphadenopathy:      Upper Body:      Right upper body: No axillary or pectoral adenopathy.      Left upper body: No axillary or pectoral adenopathy.   Skin:     General: Skin is warm.      Capillary Refill: Capillary refill takes less than 2 seconds.      Findings: No rash.   Neurological:      Mental Status: She is alert and oriented to person, place, and time.      Cranial Nerves: Cranial nerves 2-12 are intact.      Motor: Motor function is intact.      Coordination: Coordination is intact.      Gait: Gait is intact.   Psychiatric:         Mood and Affect: Mood normal.         Speech: Speech normal.         Behavior: Behavior normal. Behavior is cooperative.         Thought Content: Thought content normal.         Cognition and Memory: Cognition normal.         Judgment: Judgment normal.          ASSESSMENT AND PLAN  (Z00.00) Adult wellness visit  (primary encounter diagnosis)    (Z13.1) Diabetes mellitus screening    (Z12.11) Colon cancer screening  Plan: Referral to GENERAL SURGERY - [place] -         DUREMDES, MULLINS, HOPKINS    (Z12.31) Breast cancer screening by mammogram  Plan: MAMMO BILATERAL SCREENING, CANCELED:  MAMMO         BILATERAL SCREENING    (Z13.820) Osteoporosis screening  Plan: DEXA BONE DENSITOMETRY    Tobacco cessation counseling not performed due to nonsmoker.     Depression screening is positive. Follow up plan of care: Follow up as scheduled; Denies SI/HI/AVH..    Orders Placed This Encounter    CANCELED: MAMMO BILATERAL SCREENING    DEXA BONE DENSITOMETRY    MAMMO BILATERAL SCREENING    Referral to GENERAL SURGERY -  - DUREMDES, MULLINS, HOPKINS      Immunizations up-to-date.  Discussed safety.  Advised to follow up with dentist every  6 months for dental screenings.  Advised to follow up with ophthalmology annually for vision screenings.  Advised follow up with Dermatology annually for skin screenings.  Advised a low fat/low-sodium diet, advised 150 minutes of scheduled weekly physical activity and to maintain a healthy weight.      Return in about 1 year (around 08/09/2024) for adult wellness.      Stormy Fabian, NP-C 08/10/2023, 17:35

## 2023-08-10 NOTE — Nursing Note (Signed)
 Patient here for wellness visit. Patient complains of fatigue and kidneys hurting, and incontinence  for a while.

## 2023-08-19 ENCOUNTER — Other Ambulatory Visit: Payer: Self-pay

## 2023-08-19 ENCOUNTER — Ambulatory Visit (RURAL_HEALTH_CENTER): Payer: Self-pay | Attending: Family | Admitting: Family

## 2023-08-19 ENCOUNTER — Encounter (RURAL_HEALTH_CENTER): Payer: Self-pay | Admitting: Family

## 2023-08-19 VITALS — BP 130/80 | HR 88 | Temp 98.2°F | Wt 217.0 lb

## 2023-08-19 DIAGNOSIS — G4730 Sleep apnea, unspecified: Secondary | ICD-10-CM | POA: Insufficient documentation

## 2023-08-19 DIAGNOSIS — E559 Vitamin D deficiency, unspecified: Secondary | ICD-10-CM | POA: Insufficient documentation

## 2023-08-19 DIAGNOSIS — E785 Hyperlipidemia, unspecified: Secondary | ICD-10-CM | POA: Insufficient documentation

## 2023-08-19 DIAGNOSIS — J3089 Other allergic rhinitis: Secondary | ICD-10-CM | POA: Insufficient documentation

## 2023-08-19 DIAGNOSIS — E538 Deficiency of other specified B group vitamins: Secondary | ICD-10-CM | POA: Insufficient documentation

## 2023-08-19 DIAGNOSIS — E669 Obesity, unspecified: Secondary | ICD-10-CM | POA: Insufficient documentation

## 2023-08-19 DIAGNOSIS — Z6832 Body mass index (BMI) 32.0-32.9, adult: Secondary | ICD-10-CM | POA: Insufficient documentation

## 2023-08-19 DIAGNOSIS — I10 Essential (primary) hypertension: Secondary | ICD-10-CM | POA: Insufficient documentation

## 2023-08-19 MED ORDER — LORATADINE 10 MG TABLET
10.0000 mg | ORAL_TABLET | Freq: Every day | ORAL | 1 refills | Status: AC
Start: 2023-08-19 — End: 2023-11-17

## 2023-08-19 MED ORDER — ERGOCALCIFEROL (VITAMIN D2) 1,250 MCG (50,000 UNIT) CAPSULE
50000.0000 [IU] | ORAL_CAPSULE | ORAL | 1 refills | Status: AC
Start: 2023-08-19 — End: 2023-11-17

## 2023-08-19 MED ORDER — METOPROLOL SUCCINATE ER 25 MG TABLET,EXTENDED RELEASE 24 HR
25.0000 mg | ORAL_TABLET | Freq: Every day | ORAL | 1 refills | Status: DC
Start: 2023-08-19 — End: 2024-02-18

## 2023-08-19 MED ORDER — ROSUVASTATIN 5 MG TABLET
5.0000 mg | ORAL_TABLET | Freq: Every day | ORAL | 1 refills | Status: DC
Start: 2023-08-19 — End: 2024-02-18

## 2023-08-19 NOTE — Assessment & Plan Note (Addendum)
 COMPREHENSIVE METABOLIC PANEL  Lab Results   Component Value Date    SODIUM 140 05/21/2023    POTASSIUM 3.9 05/21/2023    CHLORIDE 105 05/21/2023    CO2 29 05/21/2023    ANIONGAP 6 05/21/2023    BUN 21 05/21/2023    CREATININE 0.63 05/21/2023    GLUCOSENF 106 05/21/2023    GLUCOSE neg 11/11/2022    CALCIUM 9.6 05/21/2023    ALBUMIN 4.4 05/21/2023    TOTALPROTEIN 7.0 05/21/2023    ALKPHOS 56 05/21/2023    AST 18 05/21/2023    ALT 18 05/21/2023    GFR 98 05/21/2023     BP stable and asymptomatic.  Managed by Cardiology at [facility].  Continue current medications.

## 2023-08-19 NOTE — Progress Notes (Signed)
 FAMILY MEDICINE, [facility] FAMILY MEDICINE [facility]  [address]  [place] [address]  Operated by [facility]     Name: Kelsey Pitts MRN:  [id number]   Date of Birth: 1956/03/20 Age: 68 y.o.   Date: 08/19/2023  Time: 17:46     Provider: Stormy Fabian, NP-C    Reason for visit: Follow Up 3 Months      History of Present Illness:  Kelsey Pitts is a 68 y.o. female presenting with chronic disease management.  Reports compliant with medications.    Patient reports she is under the care of [facility] cardiology.  Patient reports her last visit she was told she had lung nodules and a pancreatic cysts, patient reports she will be following with specialists in [facility] for further evaluation.    Denies fever, cough, nausea, vomiting or diarrhea.  Denies chest pain, palpitations or shortness for breath.  Denies recent hospitalizations or surgeries.      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  Dr. Bailey Mech, Cardiologist.      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  Dr. Bailey Mech, Cardiologist.      Chest discomfort     Chest pain 03/01/2012    Formatting of this note might be different from the original.   ICD10 Conversion    Clinical diagnosis of severe acute respiratory syndrome coronavirus 2  (SARS-CoV-2) disease     Dizzy spells 03/01/2012    DOE (dyspnea on exertion) 03/01/2012    Family history of ischemic heart disease and other diseases of the circulatory system 03/01/2012    Formatting of this note might be different from the original.   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    Knee pain     Low back pain     Lung nodule 04/2023    Malaise and fatigue 03/01/2012    Nausea 11/11/2022    Palpitations 01/02/2022    Postoperative nausea     Snores 03/01/2012    Treatment     Urinary urgency     UTI (urinary tract infection)     UTI (urinary tract infection) 11/11/2022     Past Surgical History:  Past Surgical History:   Procedure Laterality Date    BILATERAL OOPHORECTOMY N/A 2005    Dr Joylene John SECTION, CLASSIC N/A 1980    Another one in 77    HX APPENDECTOMY N/A 2005    Dr Lincoln Brigham CHOLECYSTECTOMY  1990  Dr Arleta Creek DILATION AND CURETTAGE N/A 2005    Dr Lendon Collar HYSTERECTOMY N/A 2005    Dr Lendon Collar KNEE REPLACMENT N/A 03/30/2019    Dr Lequita Halt     Allergies:  No Known Allergies  Medications:  Current Outpatient Medications   Medication Sig    Calcium Carbonate 500 mg calcium (1,250 mg) Oral Tablet, Chewable every day    ergocalciferol, vitamin D2, (DRISDOL) 1,250 mcg (50,000 unit) Oral Capsule Take 1 Capsule (50,000 Units total) by mouth Every 7 days for 90 days    loratadine (CLARITIN) 10 mg Oral Tablet Take 1 Tablet (10 mg total) by mouth Once a day for 90 days    metoprolol succinate (TOPROL-XL) 25 mg Oral Tablet Sustained Release 24 hr Take 1 Tablet (25 mg total) by mouth Once a day for 90 days    midodrine (PROAMITINE) 5 mg Oral Tablet Take 1 Tablet (5 mg total) by mouth Twice daily    rosuvastatin (CRESTOR) 5 mg Oral Tablet Take 1 Tablet (5 mg total) by mouth Once a day for 90 days     Family History:  Family Medical History:       Problem Relation (Age of Onset)    Atrial fibrillation  Sister    Congestive Heart Failure Mother    Diabetes type II Mother    Emphysema Mother    Heart Attack Father    Heart Disease Mother, Father, Sister    Hypertension (High Blood Pressure) Mother, Father    No Known Problems Brother, Half-Sister, Half-Brother, Maternal Aunt, Maternal Uncle, Paternal Aunt, Paternal Uncle, Maternal Grandmother, Maternal Grandfather, Paternal Grandmother, Paternal Grandfather, Daughter, Son    Pacemaker Sister            Social History:  Social History     Socioeconomic History    Marital status: Married     Spouse name: Kennidi Yoshida    Number of children: 3    Highest education level: 12th grade   Occupational History     Comment: [employer]; housekeeping.   Tobacco Use    Smoking status: Never    Smokeless tobacco: Never   Vaping Use    Vaping status: Never Used   Substance and Sexual Activity    Alcohol use: Never    Drug use: Never    Sexual activity: Never     Partners: Male     Comment: LMP: 2005, G40P3A0     Social Determinants of Health     Financial Resource Strain: Low Risk  (11/25/2022)    Financial Resource Strain     SDOH Financial: No   Transportation Needs: Low Risk  (11/25/2022)    Transportation Needs     SDOH Transportation: No   Social Connections: Low Risk  (11/25/2022)    Social Connections     SDOH Social Isolation: 5 or more times a week   Intimate Partner Violence: Low Risk  (11/25/2022)    Intimate Partner Violence     SDOH Domestic Violence: No   Housing Stability: Low Risk  (11/25/2022)    Housing Stability     SDOH Housing Situation: I have housing.     SDOH Housing Worry: No           Review of Systems:  Any pertinent Review of Systems as addressed in the HPI above.    Physical Exam:  Vital Signs:  Vitals:    08/19/23 1009 08/19/23 1038  BP: (!) 140/78 130/80   Pulse: 68 88   Temp: 36.8 C (98.2 F)    SpO2: 98%    Weight: 98.4 kg (217 lb)      Physical Exam  Constitutional:       Appearance: She is obese.   HENT:      Head: Normocephalic.    Cardiovascular:      Rate and Rhythm: Normal rate and regular rhythm.      Pulses: Normal pulses.      Heart sounds: Normal heart sounds, S1 normal and S2 normal.   Pulmonary:      Effort: Pulmonary effort is normal.      Breath sounds: Normal breath sounds.   Abdominal:      General: Bowel sounds are normal. There is no distension.      Palpations: Abdomen is soft. There is no mass.      Tenderness: There is no abdominal tenderness. There is no right CVA tenderness, left CVA tenderness, guarding or rebound.      Hernia: No hernia is present.   Musculoskeletal:         General: Normal range of motion.      Cervical back: Neck supple.      Right lower leg: No edema.      Left lower leg: No edema.   Skin:     General: Skin is warm.   Neurological:      General: No focal deficit present.      Mental Status: She is alert and oriented to person, place, and time. Mental status is at baseline.      Sensory: Sensation is intact.      Motor: Motor function is intact.      Coordination: Coordination is intact.      Gait: Gait is intact.   Psychiatric:         Mood and Affect: Mood normal.         Behavior: Behavior normal. Behavior is cooperative.         Thought Content: Thought content normal.         Cognition and Memory: Cognition normal.         Judgment: Judgment normal.          Assessment/Plan:  (I10) Hypertension, essential  (primary encounter diagnosis)    (E78.5) Hyperlipidemia    (G47.30) Sleep apnea    (E66.9) Obesity    (J30.89) Environmental and seasonal allergies    (E53.8) B12 deficiency    (E55.9) Vitamin D deficiency      Problem List Items Addressed This Visit          Cardiovascular System    Hypertension, essential - Primary    COMPREHENSIVE METABOLIC PANEL  Lab Results   Component Value Date    SODIUM 140 05/21/2023    POTASSIUM 3.9 05/21/2023    CHLORIDE 105 05/21/2023    CO2 29 05/21/2023    ANIONGAP 6 05/21/2023    BUN 21 05/21/2023    CREATININE 0.63 05/21/2023    GLUCOSENF 106 05/21/2023     GLUCOSE neg 11/11/2022    CALCIUM 9.6 05/21/2023    ALBUMIN 4.4 05/21/2023    TOTALPROTEIN 7.0 05/21/2023    ALKPHOS 56 05/21/2023    AST 18 05/21/2023    ALT 18 05/21/2023    GFR 98 05/21/2023     BP stable and asymptomatic.  Managed by Cardiology at [facility].  Continue current medications.  Hyperlipidemia    Lab Results   Component Value Date    CHOLESTEROL 181 05/21/2023    HDLCHOL 85 05/21/2023    LDLCHOL 84 05/21/2023    TRIG 59 05/21/2023      Continue rosuvastatin 5mg  daily. Advised to limit high fat foods, processed foods and fast food in diet.             Respiratory    Sleep apnea    Compliant with CPAP.            Endocrine    Vitamin D deficiency    Continue weekly vitamin D 50,000 IU         B12 deficiency    VITAMIN B 12   Date Value Ref Range Status   05/21/2023 951 (H) 180 - 914 pg/mL Final                Other    Obesity    BMI 32. Advise a low fat/low sodium diet, advise 150 min of exercise and to maintain a healthy weight.          Environmental and seasonal allergies    Symptoms controlled with Claritin 10 mg daily as needed.  Hydrate well.  Advised over-the-counter Tylenol/Motrin as needed.  Advised follow-up if progress or do not resolve.          Tobacco cessation counseling not performed due to nonsmoker.   Labs drawn today.  Continue current medications.  Advised to follow up with specialist as scheduled.  Advised a low-fat/low sodium diet, advised 150 minutes of scheduled weekly physical activity as tolerated.  Advised to maintain a healthy weight.     Return in about 3 months (around 11/19/2023) for routine visit.    Stormy Fabian, NP-C     Portions of this note may be dictated using voice recognition software or a dictation service. Variances in spelling and vocabulary are possible and unintentional. Not all errors are caught/corrected. Please notify the There[facility] Parkin if any discrepancies are noted or if the meaning of any statement is not clear.

## 2023-08-19 NOTE — Assessment & Plan Note (Signed)
 BMI 32. Advise a low fat/low sodium diet, advise 150 min of exercise and to maintain a healthy weight.

## 2023-08-19 NOTE — Nursing Note (Signed)
 Patient here for follow up with no new complaints

## 2023-08-19 NOTE — Assessment & Plan Note (Signed)
 VITAMIN B 12   Date Value Ref Range Status   05/21/2023 951 (H) 180 - 914 pg/mL Final

## 2023-08-19 NOTE — Assessment & Plan Note (Signed)
 Lab Results   Component Value Date    CHOLESTEROL 181 05/21/2023    HDLCHOL 85 05/21/2023    LDLCHOL 84 05/21/2023    TRIG 59 05/21/2023      Continue rosuvastatin 5mg  daily. Advised to limit high fat foods, processed foods and fast food in diet.

## 2023-08-19 NOTE — Assessment & Plan Note (Signed)
Symptoms controlled with Claritin 10 mg daily as needed.  Hydrate well.  Advised over-the-counter Tylenol/Motrin as needed.  Advised follow-up if progress or do not resolve.

## 2023-08-19 NOTE — Assessment & Plan Note (Signed)
 Continue weekly vitamin D 50, 000 IU

## 2023-08-19 NOTE — Assessment & Plan Note (Signed)
 Compliant with CPAP

## 2023-08-29 DIAGNOSIS — R918 Other nonspecific abnormal finding of lung field: Secondary | ICD-10-CM | POA: Insufficient documentation

## 2023-08-29 DIAGNOSIS — K862 Cyst of pancreas: Secondary | ICD-10-CM | POA: Insufficient documentation

## 2023-08-29 HISTORY — DX: Other nonspecific abnormal finding of lung field: R91.8

## 2023-08-29 NOTE — Assessment & Plan Note (Signed)
 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 Follow-up abdominal MRI without and with IV contrast in 6 months is recommended per current [employer] of Radiology guidance. Managed by [facility].

## 2023-08-29 NOTE — Assessment & Plan Note (Addendum)
 Chest CT 05/2023.  Repeat in 1 year. Managed by [facility].

## 2023-10-05 ENCOUNTER — Ambulatory Visit (HOSPITAL_COMMUNITY): Payer: Self-pay

## 2023-10-28 ENCOUNTER — Other Ambulatory Visit: Payer: Self-pay

## 2023-10-28 ENCOUNTER — Ambulatory Visit (HOSPITAL_BASED_OUTPATIENT_CLINIC_OR_DEPARTMENT_OTHER)
Admission: RE | Admit: 2023-10-28 | Discharge: 2023-10-28 | Disposition: A | Discharge status: Home / Self Care | Payer: Self-pay | Source: Ambulatory Visit | Attending: Family

## 2023-10-28 ENCOUNTER — Ambulatory Visit (RURAL_HEALTH_CENTER): Payer: Self-pay | Admitting: Family

## 2023-10-28 ENCOUNTER — Ambulatory Visit
Admission: RE | Admit: 2023-10-28 | Discharge: 2023-10-28 | Disposition: A | Discharge status: Home / Self Care | Source: Ambulatory Visit | Attending: Family | Admitting: Family

## 2023-10-28 ENCOUNTER — Encounter (HOSPITAL_COMMUNITY): Payer: Self-pay

## 2023-10-28 DIAGNOSIS — M85852 Other specified disorders of bone density and structure, left thigh: Secondary | ICD-10-CM

## 2023-10-28 DIAGNOSIS — Z1231 Encounter for screening mammogram for malignant neoplasm of breast: Secondary | ICD-10-CM | POA: Insufficient documentation

## 2023-10-28 DIAGNOSIS — Z13820 Encounter for screening for osteoporosis: Secondary | ICD-10-CM | POA: Insufficient documentation

## 2023-11-02 NOTE — Result Encounter Note (Signed)
 Patient informed and voiced understanding

## 2023-11-19 ENCOUNTER — Ambulatory Visit (RURAL_HEALTH_CENTER): Payer: Self-pay | Admitting: Family

## 2023-12-13 ENCOUNTER — Other Ambulatory Visit (RURAL_HEALTH_CENTER): Payer: Self-pay | Admitting: Family

## 2023-12-13 DIAGNOSIS — M8580 Other specified disorders of bone density and structure, unspecified site: Secondary | ICD-10-CM | POA: Insufficient documentation

## 2023-12-14 ENCOUNTER — Ambulatory Visit (RURAL_HEALTH_CENTER): Payer: Self-pay | Admitting: Family

## 2023-12-16 ENCOUNTER — Other Ambulatory Visit: Payer: Self-pay

## 2023-12-16 ENCOUNTER — Ambulatory Visit: Payer: Self-pay

## 2023-12-16 DIAGNOSIS — R079 Chest pain, unspecified: Secondary | ICD-10-CM | POA: Insufficient documentation

## 2023-12-16 DIAGNOSIS — G901 Familial dysautonomia [Riley-Day]: Secondary | ICD-10-CM | POA: Insufficient documentation

## 2023-12-16 DIAGNOSIS — R42 Dizziness and giddiness: Secondary | ICD-10-CM | POA: Insufficient documentation

## 2023-12-16 DIAGNOSIS — R002 Palpitations: Secondary | ICD-10-CM | POA: Insufficient documentation

## 2023-12-16 LAB — COMPREHENSIVE METABOLIC PANEL, NON-FASTING
ALBUMIN/GLOBULIN RATIO: 1.8 — ABNORMAL HIGH (ref 0.8–1.4)
ALBUMIN: 4.5 g/dL (ref 3.5–5.7)
ALKALINE PHOSPHATASE: 52 U/L (ref 34–104)
ALT (SGPT): 14 U/L (ref 7–52)
ANION GAP: 8 mmol/L (ref 4–13)
AST (SGOT): 15 U/L (ref 13–39)
BILIRUBIN TOTAL: 0.5 mg/dL (ref 0.3–1.0)
BUN/CREA RATIO: 27 — ABNORMAL HIGH (ref 6–22)
BUN: 27 mg/dL — ABNORMAL HIGH (ref 7–25)
CALCIUM, CORRECTED: 9.2 mg/dL (ref 8.9–10.8)
CALCIUM: 9.6 mg/dL (ref 8.6–10.3)
CHLORIDE: 104 mmol/L (ref 98–107)
CO2 TOTAL: 27 mmol/L (ref 21–31)
CREATININE: 0.99 mg/dL (ref 0.60–1.30)
ESTIMATED GFR: 62 mL/min/{1.73_m2} (ref >59)
GLOBULIN: 2.5 (ref 2.0–3.5)
GLUCOSE: 88 mg/dL (ref 74–109)
OSMOLALITY, CALCULATED: 282 mosm/kg (ref 270–290)
POTASSIUM: 3.5 mmol/L (ref 3.5–5.1)
PROTEIN TOTAL: 7 g/dL (ref 6.4–8.9)
SODIUM: 139 mmol/L (ref 136–145)

## 2023-12-16 LAB — CBC WITH DIFF
BASOPHIL #: 0 10*3/uL (ref 0.00–0.10)
BASOPHIL %: 0 % (ref 0–1)
EOSINOPHIL #: 0.2 10*3/uL (ref 0.00–0.50)
EOSINOPHIL %: 3 % (ref 1–7)
HCT: 40.3 % (ref 31.2–41.9)
HGB: 13.9 g/dL (ref 10.9–14.3)
LYMPHOCYTE #: 1.7 10*3/uL (ref 1.10–3.10)
LYMPHOCYTE %: 25 % (ref 16–46)
MCH: 30 pg (ref 24.7–32.8)
MCHC: 34.5 g/dL (ref 32.3–35.6)
MCV: 86.8 fL (ref 75.5–95.3)
MONOCYTE #: 0.4 10*3/uL (ref 0.20–0.90)
MONOCYTE %: 6 % (ref 4–11)
MPV: 8.7 fL (ref 7.9–10.8)
NEUTROPHIL #: 4.4 10*3/uL (ref 1.90–8.20)
NEUTROPHIL %: 66 % (ref 43–77)
PLATELETS: 218 10*3/uL (ref 140–440)
RBC: 4.64 10*6/uL (ref 3.63–4.92)
RDW: 13.2 % (ref 12.3–17.7)
WBC: 6.7 10*3/uL (ref 3.8–11.8)

## 2023-12-16 LAB — THYROID STIMULATING HORMONE (SENSITIVE TSH): TSH: 2.246 u[IU]/mL (ref 0.450–5.330)

## 2023-12-24 ENCOUNTER — Ambulatory Visit (RURAL_HEALTH_CENTER): Payer: Self-pay | Admitting: Family

## 2023-12-27 LAB — ADVANCED LIPID PANEL (CARDIO IQ)
APOLIPOPROTEIN B: 91 mg/dL — ABNORMAL HIGH
CHOLESTEROL, TOTAL: 232 mg/dL — ABNORMAL HIGH (ref <200)
CHOLESTEROL/HDL RATIO: 2.9 calc (ref <5.0)
HDL CHOLESTEROL: 81 mg/dL (ref >=50)
HDL LARGE: 9147 nmol/L (ref >6729)
LDL CHOL, CALCULATED: 128 — ABNORMAL HIGH (ref <100)
LDL MEDIUM: 345 nmol/L — ABNORMAL HIGH (ref <215)
LDL PARTICLE NUMBER: 1642 nmol/L — ABNORMAL HIGH (ref <1138)
LDL PEAK SIZE: 223 Angstrom (ref >222.9)
LDL SMALL: 225 nmol/L — ABNORMAL HIGH (ref <142)
LIPOPROTEIN (A): 18 nmol/L (ref <75)
NON-HDL CHOLESTEROL: 151 — ABNORMAL HIGH (ref <130)
TRIGLYCERIDES: 118 mg/dL (ref <150)

## 2024-01-14 ENCOUNTER — Ambulatory Visit (RURAL_HEALTH_CENTER): Payer: Self-pay | Attending: Family | Admitting: Family

## 2024-01-14 ENCOUNTER — Encounter (RURAL_HEALTH_CENTER): Payer: Self-pay | Admitting: Family

## 2024-01-14 ENCOUNTER — Other Ambulatory Visit: Payer: Self-pay

## 2024-01-14 VITALS — BP 128/70 | HR 65 | Temp 97.8°F | Wt 225.0 lb

## 2024-01-14 DIAGNOSIS — G4733 Obstructive sleep apnea (adult) (pediatric): Secondary | ICD-10-CM | POA: Insufficient documentation

## 2024-01-14 DIAGNOSIS — E669 Obesity, unspecified: Secondary | ICD-10-CM | POA: Insufficient documentation

## 2024-01-14 DIAGNOSIS — Z79899 Other long term (current) drug therapy: Secondary | ICD-10-CM | POA: Insufficient documentation

## 2024-01-14 DIAGNOSIS — Z6836 Body mass index (BMI) 36.0-36.9, adult: Secondary | ICD-10-CM | POA: Insufficient documentation

## 2024-01-14 DIAGNOSIS — E785 Hyperlipidemia, unspecified: Secondary | ICD-10-CM | POA: Insufficient documentation

## 2024-01-14 DIAGNOSIS — I10 Essential (primary) hypertension: Secondary | ICD-10-CM | POA: Insufficient documentation

## 2024-01-14 DIAGNOSIS — E559 Vitamin D deficiency, unspecified: Secondary | ICD-10-CM | POA: Insufficient documentation

## 2024-01-14 DIAGNOSIS — J020 Streptococcal pharyngitis: Secondary | ICD-10-CM

## 2024-01-14 HISTORY — DX: Streptococcal pharyngitis: J02.0

## 2024-01-14 MED ORDER — AMOXICILLIN 500 MG CAPSULE
500.0000 mg | ORAL_CAPSULE | Freq: Two times a day (BID) | ORAL | 0 refills | Status: DC
Start: 2024-01-14 — End: 2024-02-18

## 2024-01-14 NOTE — Assessment & Plan Note (Signed)
 Compliant with CPAP

## 2024-01-14 NOTE — Assessment & Plan Note (Signed)
 Continue weekly vitamin D 50, 000 IU

## 2024-01-14 NOTE — Progress Notes (Signed)
 [facility] MEDICINE [facility]  [facility] FAMILY MEDICINE      NARCISSUS DETWILER  MRN: [id number]  DOB: 03-20-1956  Date of Service: 01/14/2024    CHIEF COMPLAINT  Chief Complaint   Patient presents with    Feel Sick       SUBJECTIVE  Kelsey Pitts is a 68 y.o. female who presents to clinic for runny nose, sneezing, sinus congestion and drainage, headache and fatigue x1 week.  Denies known exposure to sick contacts.  Denies fever, nausea, vomiting or diarrhea.  Denies chest pain, palpitations or shortness for breath.  Denies taking over-the-counter medications.  Patient reports she is eating and drinking well.  Reports normal bowel movements and normal voiding urine.  Denies antibiotic use in past 30 days.  Denies recent hospitalizations or surgeries..     Review of Systems:  Positive ROS discussed in HPI, otherwise all other systems negative.      Medications:   Calcium Carbonate 500 mg calcium (1,250 mg) Oral Tablet, Chewable, every day  metoprolol  succinate (TOPROL -XL) 25 mg Oral Tablet Sustained Release 24 hr, Take 1 Tablet (25 mg total) by mouth Once a day for 90 days  midodrine (PROAMITINE) 5 mg Oral Tablet, Take 1 Tablet (5 mg total) by mouth Twice daily  rosuvastatin  (CRESTOR ) 5 mg Oral Tablet, Take 1 Tablet (5 mg total) by mouth Once a day for 90 days    No facility-administered medications prior to visit.      Allergies:   Allergies[1]      OBJECTIVE  BP 128/70 (Site: Right Arm, Patient Position: Sitting, Cuff Size: Adult Large)   Pulse 65   Temp 36.6 C (97.8 F)   Wt 102 kg (225 lb)   LMP  (LMP Unknown)   SpO2 98%   BMI 36.32 kg/m       Physical Exam  Vitals reviewed.   Constitutional:       Appearance: She is obese.   HENT:      Head: Normocephalic.      Right Ear: Tympanic membrane, ear canal and external ear normal.      Left Ear: Tympanic membrane, ear canal and external ear normal.      Nose: Rhinorrhea present. Rhinorrhea is clear.      Right Turbinates: Pale.      Left  Turbinates: Pale.      Right Sinus: No maxillary sinus tenderness or frontal sinus tenderness.      Left Sinus: No maxillary sinus tenderness or frontal sinus tenderness.      Mouth/Throat:      Mouth: Mucous membranes are moist.      Dentition: Has dentures.      Pharynx: Posterior oropharyngeal erythema present. No oropharyngeal exudate.   Eyes:      Conjunctiva/sclera: Conjunctivae normal.   Cardiovascular:      Rate and Rhythm: Normal rate and regular rhythm.      Heart sounds: Normal heart sounds.   Pulmonary:      Breath sounds: Normal breath sounds.   Musculoskeletal:      Cervical back: Normal range of motion.   Lymphadenopathy:      Cervical: Cervical adenopathy present.      Right cervical: Superficial cervical adenopathy present.      Left cervical: Superficial cervical adenopathy present.   Skin:     Findings: No rash.   Neurological:      Cranial Nerves: Cranial nerves 2-12 are intact.   Psychiatric:  Behavior: Behavior is cooperative.         Cognition and Memory: Cognition normal.           ASSESSMENT/PLAN  (J02.0) Strep pharyngitis  (primary encounter diagnosis)       Problem List Items Addressed This Visit          Digestive    Strep pharyngitis - Primary    Start amoxicillin  500 mg p.o. b.i.d. times 10 days for strep pharyngitis.    COVID screening is negative.Complete antibiotic therapy as prescribed.  Advise over-the-counter Tylenol/Motrin as needed.  Hydrate well. Discussed strict hand washing.  Avoid sharing food/drinks.  Patient will change toothbrush after 2 full days of antibiotic therapy.  Advise to follow-up if symptoms progress or do not resolve.             Orders Placed This Encounter    amoxicillin  (AMOXIL ) 500 mg Oral Capsule        Return in about 2 weeks (around 01/28/2024) for routine visit.    Donny LITTIE Clonts, NP-C 01/14/2024, 10:18         [1] No Known Allergies

## 2024-01-14 NOTE — Assessment & Plan Note (Signed)
 BMI 36. Advise a low fat/low sodium diet, advise 150 min of exercise and to maintain a healthy weight.

## 2024-01-14 NOTE — Assessment & Plan Note (Signed)
 Lab Results   Component Value Date    CHOLESTEROL 181 05/21/2023    HDLCHOL 85 05/21/2023    LDLCHOL 84 05/21/2023    TRIG 118 12/16/2023      Continue rosuvastatin  5mg  daily. Advised to limit high fat foods, processed foods and fast food in diet.

## 2024-01-14 NOTE — Assessment & Plan Note (Signed)
 Start amoxicillin  500 mg p.o. b.i.d. times 10 days for strep pharyngitis.    COVID screening is negative.Complete antibiotic therapy as prescribed.  Advise over-the-counter Tylenol/Motrin as needed.  Hydrate well. Discussed strict hand washing.  Avoid sharing food/drinks.  Patient will change toothbrush after 2 full days of antibiotic therapy.  Advise to follow-up if symptoms progress or do not resolve.

## 2024-01-14 NOTE — Nursing Note (Signed)
 Patient complains of runny nose, sneezing, drainage, scratchy throat, headache, and fatigue x 1 week

## 2024-01-14 NOTE — Assessment & Plan Note (Signed)
 VITAMIN B 12   Date Value Ref Range Status   05/21/2023 951 (H) 180 - 914 pg/mL Final

## 2024-01-14 NOTE — Assessment & Plan Note (Signed)
 COMPREHENSIVE METABOLIC PANEL  Lab Results   Component Value Date    SODIUM 139 12/16/2023    POTASSIUM 3.5 12/16/2023    CHLORIDE 104 12/16/2023    CO2 27 12/16/2023    ANIONGAP 8 12/16/2023    BUN 27 (H) 12/16/2023    CREATININE 0.99 12/16/2023    GLUCOSENF 88 12/16/2023    GLUCOSE neg 11/11/2022    CALCIUM 9.6 12/16/2023    ALBUMIN 4.5 12/16/2023    TOTALPROTEIN 7.0 12/16/2023    ALKPHOS 52 12/16/2023    AST 15 12/16/2023    ALT 14 12/16/2023    GFR 62 12/16/2023     BP stable and asymptomatic.  Managed by Cardiology at [facility].  Continue current medications.

## 2024-02-02 ENCOUNTER — Encounter (RURAL_HEALTH_CENTER): Payer: Self-pay | Admitting: Family

## 2024-02-02 ENCOUNTER — Other Ambulatory Visit: Payer: Self-pay

## 2024-02-02 ENCOUNTER — Ambulatory Visit (RURAL_HEALTH_CENTER): Payer: Self-pay | Attending: Family | Admitting: Family

## 2024-02-02 VITALS — BP 130/76 | HR 58 | Temp 97.5°F | Wt 228.0 lb

## 2024-02-02 DIAGNOSIS — J309 Allergic rhinitis, unspecified: Secondary | ICD-10-CM | POA: Insufficient documentation

## 2024-02-02 LAB — POCT RAPID STREP A: RAPID STREP A (POCT): NEGATIVE

## 2024-02-02 LAB — POCT RAPID COVID (SOFIA) (AMB ONLY): COVID-19 AG: NEGATIVE

## 2024-02-02 MED ORDER — DEXAMETHASONE SODIUM PHOSPHATE 4 MG/ML INJECTION SOLUTION
4.0000 mg | INTRAMUSCULAR | Status: AC
Start: 2024-02-02 — End: 2024-02-02
  Administered 2024-02-02: 4 mg via INTRAMUSCULAR

## 2024-02-02 NOTE — Assessment & Plan Note (Addendum)
 Dexamethasone  10 mg IM given today.  Advised to continue Zyrtec  and fluticasone ; prescriptions she has at home.  Hydrate well.  Advise OTC tylenol/Motrin PRN.  Advised to follow up if symptoms progress or do not resolve.     Strep and Covid screenings are negative. Patient verbalized understanding.

## 2024-02-02 NOTE — Nursing Note (Signed)
 Patient complains of scratchy throat, drainage, cough, left ear pain, and fatigue since Thursday

## 2024-02-02 NOTE — Progress Notes (Signed)
 [facility] MEDICINE [facility]  [facility] FAMILY MEDICINE      Kelsey Pitts  MRN: [id number]  DOB: 1955/08/18  Date of Service: 02/02/2024    CHIEF COMPLAINT  Chief Complaint   Patient presents with    Feel Sick       SUBJECTIVE  Kelsey Pitts is a 68 y.o. female who presents to clinic for exposure to COVID with symptoms of sore throat, clear sinus drainage, ear pressure and fatigue x 1 week. Patient reports she has been more tired than usual.  Denies fever, cough, nausea, vomiting or diarrhea.  Denies chest pain, palpitations or shortness of breath.  Denies headache or rash.  Reports she is eating and drinking well.  Reports normal bowel movements and normal voiding of urine. .     Review of Systems:  Positive ROS discussed in HPI, otherwise all other systems negative.      Medications:   Calcium Carbonate 500 mg calcium (1,250 mg) Oral Tablet, Chewable, every day  metoprolol  succinate (TOPROL -XL) 25 mg Oral Tablet Sustained Release 24 hr, Take 1 Tablet (25 mg total) by mouth Once a day for 90 days  midodrine (PROAMITINE) 5 mg Oral Tablet, Take 1 Tablet (5 mg total) by mouth Twice daily  rosuvastatin  (CRESTOR ) 5 mg Oral Tablet, Take 1 Tablet (5 mg total) by mouth Once a day for 90 days    No facility-administered medications prior to visit.      Allergies:   Allergies[1]      OBJECTIVE  BP 130/76 (Site: Right Arm, Patient Position: Sitting, Cuff Size: Adult Large)   Pulse 58   Temp 36.4 C (97.5 F)   Wt 103 kg (228 lb)   LMP  (LMP Unknown)   SpO2 98%   BMI 36.80 kg/m       Physical Exam  Vitals reviewed.   HENT:      Head: Normocephalic.      Right Ear: Hearing, ear canal and external ear normal. Tympanic membrane is bulging.      Left Ear: Hearing, ear canal and external ear normal. Tympanic membrane is bulging.      Nose: Mucosal edema and rhinorrhea present. No congestion. Rhinorrhea is clear.      Right Turbinates: Enlarged, swollen and pale.      Left Turbinates: Enlarged, swollen and  pale.      Right Sinus: No maxillary sinus tenderness or frontal sinus tenderness.      Left Sinus: No maxillary sinus tenderness or frontal sinus tenderness.      Mouth/Throat:      Mouth: Mucous membranes are moist.      Pharynx: Posterior oropharyngeal erythema and postnasal drip present. No oropharyngeal exudate.   Eyes:      Conjunctiva/sclera: Conjunctivae normal.   Neck:      Trachea: Trachea and phonation normal.   Cardiovascular:      Rate and Rhythm: Normal rate and regular rhythm.      Heart sounds: Normal heart sounds.   Pulmonary:      Effort: Pulmonary effort is normal.      Breath sounds: Normal breath sounds.   Skin:     Findings: No rash.   Neurological:      Mental Status: She is alert and oriented to person, place, and time. Mental status is at baseline.   Psychiatric:         Mood and Affect: Affect is tearful.         Behavior: Behavior  is cooperative.           ASSESSMENT/PLAN  (J30.9) Allergic rhinitis  (primary encounter diagnosis)  Plan: POCT Rapid Covid Dalila), POCT Rapid Strep,         dexAMETHasone  4 mg/mL injection       Problem List Items Addressed This Visit          Ear, Nose, and Throat    Allergic rhinitis - Primary    Dexamethasone  10 mg IM given today.  Advised to continue Zyrtec  and fluticasone ; prescriptions she has at home.  Hydrate well.  Advise OTC tylenol/Motrin PRN.  Advised to follow up if symptoms progress or do not resolve.     Strep and Covid screenings are negative. Patient verbalized understanding.          Relevant Medications    dexAMETHasone  4 mg/mL injection (Completed)    Other Relevant Orders    POCT Rapid Covid Dalila) (Completed)    POCT Rapid Strep (Completed)      Orders Placed This Encounter    POCT Rapid Covid Dalila)    POCT Rapid Strep    dexAMETHasone  4 mg/mL injection        Return in about 2 weeks (around 02/16/2024) for routine visit.    Donny LITTIE Clonts, NP-C 02/02/2024, 19:47       [1] No Known Allergies

## 2024-02-18 ENCOUNTER — Ambulatory Visit: Payer: Self-pay | Attending: Family | Admitting: Family

## 2024-02-18 ENCOUNTER — Ambulatory Visit (RURAL_HEALTH_CENTER): Payer: Self-pay | Attending: Family | Admitting: Family

## 2024-02-18 ENCOUNTER — Other Ambulatory Visit: Payer: Self-pay

## 2024-02-18 ENCOUNTER — Encounter (RURAL_HEALTH_CENTER): Payer: Self-pay | Admitting: Family

## 2024-02-18 VITALS — BP 129/75 | HR 58 | Temp 97.8°F | Wt 228.0 lb

## 2024-02-18 DIAGNOSIS — E538 Deficiency of other specified B group vitamins: Secondary | ICD-10-CM | POA: Insufficient documentation

## 2024-02-18 DIAGNOSIS — G4730 Sleep apnea, unspecified: Secondary | ICD-10-CM | POA: Insufficient documentation

## 2024-02-18 DIAGNOSIS — E559 Vitamin D deficiency, unspecified: Secondary | ICD-10-CM | POA: Insufficient documentation

## 2024-02-18 DIAGNOSIS — F320 Major depressive disorder, single episode, mild: Secondary | ICD-10-CM | POA: Insufficient documentation

## 2024-02-18 DIAGNOSIS — E669 Obesity, unspecified: Secondary | ICD-10-CM | POA: Insufficient documentation

## 2024-02-18 DIAGNOSIS — Z6836 Body mass index (BMI) 36.0-36.9, adult: Secondary | ICD-10-CM | POA: Insufficient documentation

## 2024-02-18 DIAGNOSIS — E785 Hyperlipidemia, unspecified: Secondary | ICD-10-CM | POA: Insufficient documentation

## 2024-02-18 DIAGNOSIS — Z79899 Other long term (current) drug therapy: Secondary | ICD-10-CM | POA: Insufficient documentation

## 2024-02-18 DIAGNOSIS — J3089 Other allergic rhinitis: Secondary | ICD-10-CM | POA: Insufficient documentation

## 2024-02-18 DIAGNOSIS — Z23 Encounter for immunization: Secondary | ICD-10-CM | POA: Insufficient documentation

## 2024-02-18 DIAGNOSIS — Z1211 Encounter for screening for malignant neoplasm of colon: Secondary | ICD-10-CM | POA: Insufficient documentation

## 2024-02-18 DIAGNOSIS — I10 Essential (primary) hypertension: Secondary | ICD-10-CM | POA: Insufficient documentation

## 2024-02-18 LAB — COMPREHENSIVE METABOLIC PNL, FASTING
ALBUMIN/GLOBULIN RATIO: 1.8 — ABNORMAL HIGH (ref 0.8–1.4)
ALBUMIN: 4.4 g/dL (ref 3.5–5.7)
ALKALINE PHOSPHATASE: 57 U/L (ref 34–104)
ALT (SGPT): 15 U/L (ref 7–52)
ANION GAP: 4 mmol/L (ref 4–13)
AST (SGOT): 17 U/L (ref 13–39)
BILIRUBIN TOTAL: 0.6 mg/dL (ref 0.3–1.0)
BUN/CREA RATIO: 37 — ABNORMAL HIGH (ref 6–22)
BUN: 26 mg/dL — ABNORMAL HIGH (ref 7–25)
CALCIUM, CORRECTED: 9.3 mg/dL (ref 8.9–10.8)
CALCIUM: 9.6 mg/dL (ref 8.6–10.3)
CHLORIDE: 106 mmol/L (ref 98–107)
CO2 TOTAL: 29 mmol/L (ref 21–31)
CREATININE: 0.7 mg/dL (ref 0.60–1.30)
ESTIMATED GFR: 95 mL/min/1.73mˆ2 (ref >59)
GLOBULIN: 2.5 (ref 2.0–3.5)
GLUCOSE: 93 mg/dL (ref 74–109)
OSMOLALITY, CALCULATED: 282 mosm/kg (ref 270–290)
POTASSIUM: 4.1 mmol/L (ref 3.5–5.1)
PROTEIN TOTAL: 6.9 g/dL (ref 6.4–8.9)
SODIUM: 139 mmol/L (ref 136–145)

## 2024-02-18 LAB — CBC
HCT: 39.8 % (ref 31.2–41.9)
HGB: 13.7 g/dL (ref 10.9–14.3)
MCH: 29.9 pg (ref 24.7–32.8)
MCHC: 34.5 g/dL (ref 32.3–35.6)
MCV: 86.8 fL (ref 75.5–95.3)
MPV: 9.4 fL (ref 7.9–10.8)
PLATELETS: 206 x10ˆ3/uL (ref 140–440)
RBC: 4.59 x10ˆ6/uL (ref 3.63–4.92)
RDW: 13.3 % (ref 12.3–17.7)
WBC: 5 x10ˆ3/uL (ref 3.8–11.8)

## 2024-02-18 LAB — LIPID PANEL
CHOL/HDL RATIO: 2.2
CHOLESTEROL: 172 mg/dL (ref <200)
HDL CHOL: 77 mg/dL (ref >=40)
LDL CALC: 85 mg/dL (ref 0–100)
TRIGLYCERIDES: 51 mg/dL (ref <=150)
VLDL CALC: 10 mg/dL (ref 0–50)

## 2024-02-18 LAB — MAGNESIUM: MAGNESIUM: 2.2 mg/dL (ref 1.9–2.7)

## 2024-02-18 LAB — VITAMIN D 25 TOTAL: VITAMIN D 25, TOTAL: 27.74 ng/mL — ABNORMAL LOW (ref 30.00–100.00)

## 2024-02-18 LAB — VITAMIN B12: VITAMIN B 12: 897 pg/mL (ref 180–914)

## 2024-02-18 LAB — THYROID STIMULATING HORMONE (SENSITIVE TSH): TSH: 4.111 u[IU]/mL (ref 0.450–5.330)

## 2024-02-18 MED ORDER — SERTRALINE 25 MG TABLET
25.0000 mg | ORAL_TABLET | Freq: Every day | ORAL | 1 refills | Status: DC
Start: 2024-02-18 — End: 2024-03-29

## 2024-02-18 MED ORDER — METOPROLOL SUCCINATE ER 25 MG TABLET,EXTENDED RELEASE 24 HR
25.0000 mg | ORAL_TABLET | Freq: Every day | ORAL | 1 refills | Status: DC
Start: 2024-02-18 — End: 2024-02-18

## 2024-02-18 MED ORDER — METOPROLOL SUCCINATE ER 25 MG TABLET,EXTENDED RELEASE 24 HR: 25.0000 mg | ORAL_TABLET | Freq: Every day | ORAL | 2 refills | Status: AC

## 2024-02-18 MED ORDER — ROSUVASTATIN 5 MG TABLET
5.0000 mg | ORAL_TABLET | Freq: Every day | ORAL | 1 refills | Status: DC
Start: 2024-02-18 — End: 2024-02-18

## 2024-02-18 MED ORDER — LORATADINE 10 MG TABLET: 10.0000 mg | ORAL_TABLET | Freq: Every day | ORAL | 1 refills | Status: AC

## 2024-02-18 MED ORDER — ROSUVASTATIN 5 MG TABLET: 5.0000 mg | ORAL_TABLET | Freq: Every day | ORAL | 2 refills | Status: AC

## 2024-02-18 NOTE — Assessment & Plan Note (Signed)
Symptoms controlled with Claritin 10 mg daily as needed.  Hydrate well.  Advised over-the-counter Tylenol/Motrin as needed.  Advised follow-up if progress or do not resolve.

## 2024-02-18 NOTE — Assessment & Plan Note (Signed)
 COMPREHENSIVE METABOLIC PANEL  Lab Results   Component Value Date    SODIUM 139 12/16/2023    POTASSIUM 3.5 12/16/2023    CHLORIDE 104 12/16/2023    CO2 27 12/16/2023    ANIONGAP 8 12/16/2023    BUN 27 (H) 12/16/2023    CREATININE 0.99 12/16/2023    GLUCOSENF 88 12/16/2023    GLUCOSE neg 11/11/2022    CALCIUM 9.6 12/16/2023    ALBUMIN 4.5 12/16/2023    TOTALPROTEIN 7.0 12/16/2023    ALKPHOS 52 12/16/2023    AST 15 12/16/2023    ALT 14 12/16/2023    GFR 62 12/16/2023     BP stable and asymptomatic.  Managed by Cardiology at [facility].  Continue current medications.

## 2024-02-18 NOTE — Progress Notes (Signed)
 FAMILY MEDICINE, [facility] FAMILY MEDICINE [facility]  [address]  [place] [address]  Operated by [facility]     Name: Kelsey Pitts MRN:  [id number]   Date of Birth: 01-26-1956 Age: 68 y.o.   Date: 02/18/2024  Time: 09:08     Provider: Donny LITTIE Clonts, NP-C    Reason for visit: Follow Up 3 Months      History of Present Illness:  Kelsey Pitts is a 68 y.o. female presenting with chronic disease management.  Reports compliant with medications.     Patient complains of depression for the past 6 months.  Denies SI/HI/AVH.  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  Reports she cries often and stays tired and stressed.  Patient is not interested in counseling and psychiatry referral at this time. Reports she has a good family support.    Denies fever, cough, nausea, vomiting or diarrhea.  Denies chest pain, palpitations or shortness for breath.  Denies recent hospitalizations or surgeries.      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  Dr. Anastasio, Cardiologist.      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  Dr. Anastasio, Cardiologist.      Chest discomfort     Chest pain 03/01/2012     Formatting of this note might be different from the original.   ICD10 Conversion    Clinical diagnosis of severe acute respiratory syndrome coronavirus 2 (SARS-CoV-2) disease     Dizzy spells 03/01/2012    DOE (dyspnea on exertion) 03/01/2012    Family history of ischemic heart disease and other diseases of the circulatory system 03/01/2012    Formatting of this note might be different from the original.   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    Knee pain     Low back pain     Lung nodule 04/2023    Malaise and fatigue 03/01/2012    Multiple lung nodules on CT 08/29/2023    Nausea 11/11/2022    Palpitations 01/02/2022    Postoperative nausea     Snores 03/01/2012    Strep pharyngitis 01/14/2024    Urinary urgency     UTI (urinary tract infection)     UTI (urinary tract infection) 11/11/2022     Past Surgical History:  Past Surgical History:   Procedure Laterality Date    BILATERAL OOPHORECTOMY N/A 2005  Dr Jackolyn ACOSTA SECTION, CLASSIC N/A 1980    Another one in 70    HX APPENDECTOMY N/A 2005    Dr Steen JOY CHOLECYSTECTOMY  1990    Dr Marlyce JOY DILATION AND CURETTAGE N/A 2005    Dr Jackolyn JOY HYSTERECTOMY N/A 2005    Dr Jackolyn JOY KNEE REPLACMENT N/A 03/30/2019    Dr Joesph     Allergies:  No Known Allergies  Medications:  Current Outpatient Medications   Medication Sig    Calcium Carbonate 500 mg calcium (1,250 mg) Oral Tablet, Chewable every day    loratadine  (CLARITIN ) 10 mg Oral Tablet Take 1 Tablet (10 mg total) by mouth Daily for 90 days    metoprolol  succinate (TOPROL -XL) 25 mg Oral Tablet Sustained Release 24 hr Take 1 Tablet (25 mg total) by mouth Daily for 90 days    midodrine (PROAMITINE) 5 mg Oral Tablet Take 1 Tablet (5 mg total) by mouth Twice daily    rosuvastatin  (CRESTOR ) 5 mg Oral Tablet Take 1 Tablet (5 mg total) by mouth Daily for 90 days    sertraline  (ZOLOFT ) 25 mg Oral Tablet Take 1 Tablet  (25 mg total) by mouth Daily for 30 days     Family History:  Family Medical History:       Problem Relation (Age of Onset)    Atrial fibrillation Sister    Congestive Heart Failure Mother    Diabetes type II Mother    Emphysema Mother    Heart Attack Father    Heart Disease Mother, Father, Sister    Hypertension (High Blood Pressure) Mother, Father    No Known Problems Brother, Half-Sister, Half-Brother, Maternal Aunt, Maternal Uncle, Paternal Aunt, Paternal Uncle, Maternal Grandmother, Maternal Grandfather, Paternal Grandmother, Paternal Grandfather, Daughter, Son    Pacemaker Sister            Social History:  Social History     Socioeconomic History    Marital status: Married     Spouse name: Maliea Grandmaison    Number of children: 3    Highest education level: 12th grade   Occupational History     Comment: [employer]; housekeeping.   Tobacco Use    Smoking status: Never    Smokeless tobacco: Never   Vaping Use    Vaping status: Never Used   Substance and Sexual Activity    Alcohol use: Never    Drug use: Never    Sexual activity: Never     Partners: Male     Comment: LMP: 2005, [id number]     Social Determinants of Health     Financial Resource Strain: Low Risk  (11/25/2022)    Financial Resource Strain     SDOH Financial: No   Transportation Needs: Low Risk  (11/25/2022)    Transportation Needs     SDOH Transportation: No   Social Connections: Low Risk  (11/25/2022)    Social Connections     SDOH Social Isolation: 5 or more times a week   Intimate Partner Violence: Low Risk  (11/25/2022)    Intimate Partner Violence     SDOH Domestic Violence: No   Housing Stability: Low Risk  (11/25/2022)    Housing Stability     SDOH Housing Situation: I have housing.     SDOH Housing Worry: No           Review of Systems:  Any pertinent  Review of Systems as addressed in the HPI above.    Physical Exam:  Vital Signs:  Vitals:    02/18/24 0845   BP: 129/75   Pulse: 58   Temp: 36.6 C (97.8 F)   SpO2: 98%   Weight: 103 kg (228 lb)      Physical Exam  Constitutional:       Appearance: She is obese.   HENT:      Head: Normocephalic.      Nose: Mucosal edema and rhinorrhea present. Rhinorrhea is clear.      Right Turbinates: Pale.      Left Turbinates: Pale.      Mouth/Throat:      Mouth: Mucous membranes are moist.      Pharynx: Postnasal drip present.   Cardiovascular:      Rate and Rhythm: Normal rate and regular rhythm.      Pulses: Normal pulses.      Heart sounds: Normal heart sounds, S1 normal and S2 normal.   Pulmonary:      Effort: Pulmonary effort is normal.      Breath sounds: Normal breath sounds.   Abdominal:      General: Bowel sounds are normal.      Palpations: Abdomen is soft.   Musculoskeletal:         General: Normal range of motion.      Cervical back: Neck supple.      Right lower leg: No edema.      Left lower leg: No edema.   Skin:     General: Skin is warm.      Findings: No rash.   Neurological:      General: No focal deficit present.      Mental Status: She is alert and oriented to person, place, and time. Mental status is at baseline.      Sensory: Sensation is intact.      Motor: Motor function is intact.      Coordination: Coordination is intact.      Gait: Gait is intact.   Psychiatric:         Mood and Affect: Mood normal.         Behavior: Behavior normal. Behavior is cooperative.         Thought Content: Thought content normal.         Cognition and Memory: Cognition normal.         Judgment: Judgment normal.          Assessment/Plan:  (I10) Hypertension, essential  (primary encounter diagnosis)  Plan: CBC, COMPREHENSIVE METABOLIC PNL, FASTING,         LIPID PANEL, MAGNESIUM, THYROID  STIMULATING         HORMONE (SENSITIVE TSH), VITAMIN D  25 TOTAL,         VITAMIN B12    (G47.30) Sleep apnea  Plan: CBC, COMPREHENSIVE METABOLIC PNL, FASTING,         LIPID PANEL, MAGNESIUM, THYROID  STIMULATING         HORMONE (SENSITIVE TSH), VITAMIN D  25 TOTAL,         VITAMIN B12    (E55.9) Vitamin D  deficiency  Plan: CBC,  COMPREHENSIVE METABOLIC PNL, FASTING,         LIPID PANEL, MAGNESIUM, THYROID  STIMULATING         HORMONE (SENSITIVE TSH), VITAMIN D  25 TOTAL,         VITAMIN B12    (E53.8) B12 deficiency  Plan: CBC, COMPREHENSIVE METABOLIC PNL,  FASTING,         LIPID PANEL, MAGNESIUM, THYROID  STIMULATING         HORMONE (SENSITIVE TSH), VITAMIN D  25 TOTAL,         VITAMIN B12    (E78.5) Hyperlipidemia  Plan: CBC, COMPREHENSIVE METABOLIC PNL, FASTING,         LIPID PANEL, MAGNESIUM, THYROID  STIMULATING         HORMONE (SENSITIVE TSH), VITAMIN D  25 TOTAL,         VITAMIN B12    (E66.9) Obesity  Plan: CBC, COMPREHENSIVE METABOLIC PNL, FASTING,         LIPID PANEL, MAGNESIUM, THYROID  STIMULATING         HORMONE (SENSITIVE TSH), VITAMIN D  25 TOTAL,         VITAMIN B12    (Z12.11) Colon cancer screening  Plan: Referral to GENERAL SURGERY - [place] -         DUREMDES, MULLINS, HOPKINS    (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)    (J30.89) Environmental and seasonal allergies      Problem List Items Addressed This Visit          Cardiovascular System    Hypertension, essential - Primary    COMPREHENSIVE METABOLIC PANEL  Lab Results   Component Value Date    SODIUM 139 12/16/2023    POTASSIUM 3.5 12/16/2023    CHLORIDE 104 12/16/2023    CO2 27 12/16/2023    ANIONGAP 8 12/16/2023    BUN 27 (H) 12/16/2023    CREATININE 0.99 12/16/2023    GLUCOSENF 88 12/16/2023    GLUCOSE neg 11/11/2022    CALCIUM 9.6 12/16/2023    ALBUMIN 4.5 12/16/2023    TOTALPROTEIN 7.0 12/16/2023    ALKPHOS 52 12/16/2023    AST 15 12/16/2023    ALT 14 12/16/2023    GFR 62 12/16/2023     BP stable and asymptomatic.  Managed by Cardiology at [facility].  Continue current medications.         Relevant Orders    CBC    COMPREHENSIVE METABOLIC PNL, FASTING    LIPID PANEL    MAGNESIUM    THYROID  STIMULATING HORMONE (SENSITIVE TSH)    VITAMIN D  25 TOTAL    VITAMIN B12    Hyperlipidemia    Lab Results   Component Value Date    CHOLESTEROL 181 05/21/2023    HDLCHOL 85  05/21/2023    LDLCHOL 84 05/21/2023    TRIG 118 12/16/2023      Continue rosuvastatin  5mg  daily. Advised to limit high fat foods, processed foods and fast food in diet.          Relevant Orders    CBC    COMPREHENSIVE METABOLIC PNL, FASTING    LIPID PANEL    MAGNESIUM    THYROID  STIMULATING HORMONE (SENSITIVE TSH)    VITAMIN D  25 TOTAL    VITAMIN B12       Respiratory    Sleep apnea    Patient reports she is unable to tolerate CPAP. No monger followed by Dr. ALONSO Blanch.         Relevant Orders    CBC    COMPREHENSIVE METABOLIC PNL, FASTING    LIPID PANEL    MAGNESIUM    THYROID  STIMULATING HORMONE (SENSITIVE TSH)    VITAMIN D  25 TOTAL    VITAMIN B12       Endocrine    Vitamin D  deficiency  Continue weekly vitamin D  50,000 IU         Relevant Orders    CBC    COMPREHENSIVE METABOLIC PNL, FASTING    LIPID PANEL    MAGNESIUM    THYROID  STIMULATING HORMONE (SENSITIVE TSH)    VITAMIN D  25 TOTAL    VITAMIN B12    B12 deficiency    Relevant Orders    CBC    COMPREHENSIVE METABOLIC PNL, FASTING    LIPID PANEL    MAGNESIUM    THYROID  STIMULATING HORMONE (SENSITIVE TSH)    VITAMIN D  25 TOTAL    VITAMIN B12       Psychiatric    Mild major depression (CMS HCC)    Start Zoloft  25 mg q hs.  Discussed s/e and risks.  Advised to take at bedtime.  Patient will follow up in 4 weeks.  Advised to follow up sooner if needed.            Other    Obesity    BMI 36. Weight: 103 kg (228 lb)  Advise a low fat/low sodium diet, advise 150 min of exercise and to maintain a healthy weight.          Relevant Orders    CBC    COMPREHENSIVE METABOLIC PNL, FASTING    LIPID PANEL    MAGNESIUM    THYROID  STIMULATING HORMONE (SENSITIVE TSH)    VITAMIN D  25 TOTAL    VITAMIN B12    Environmental and seasonal allergies    Symptoms controlled with Claritin  10 mg daily as needed.  Hydrate well.  Advised over-the-counter Tylenol/Motrin as needed.  Advised follow-up if progress or do not resolve.         Colon cancer screening    Relevant Orders    Referral  to GENERAL SURGERY - [place] - DUREMDES, MULLINS, HOPKINS     Tobacco cessation counseling not performed due to nonsmoker.   Labs drawn today.  Continue current medications.  Advised to follow up with specialist as scheduled.  Advised a low-fat/low sodium diet, advised 150 minutes of scheduled weekly physical activity as tolerated.  Advised to maintain a healthy weight.     Return in about 3 months (around 05/19/2024) for routine visit.    Donny LITTIE Clonts, NP-C     Portions of this note may be dictated using voice recognition software or a dictation service. Variances in spelling and vocabulary are possible and unintentional. Not all errors are caught/corrected. Please notify the dino if any discrepancies are noted or if the meaning of any statement is not clear.

## 2024-02-18 NOTE — Assessment & Plan Note (Signed)
 BMI 36. Weight: 103 kg (228 lb)  Advise a low fat/low sodium diet, advise 150 min of exercise and to maintain a healthy weight.

## 2024-02-18 NOTE — Assessment & Plan Note (Addendum)
 Patient reports she is unable to tolerate CPAP. No monger followed by Dr. ALONSO Blanch.

## 2024-02-18 NOTE — Assessment & Plan Note (Signed)
 Continue weekly vitamin D 50, 000 IU

## 2024-02-18 NOTE — Assessment & Plan Note (Signed)
 Start Zoloft  25 mg q hs.  Discussed s/e and risks.  Advised to take at bedtime.  Patient will follow up in 4 weeks.  Advised to follow up sooner if needed.

## 2024-02-18 NOTE — Nursing Note (Signed)
 Patient here for follow up with no new complaints

## 2024-02-18 NOTE — Assessment & Plan Note (Signed)
 Lab Results   Component Value Date    CHOLESTEROL 181 05/21/2023    HDLCHOL 85 05/21/2023    LDLCHOL 84 05/21/2023    TRIG 118 12/16/2023      Continue rosuvastatin  5mg  daily. Advised to limit high fat foods, processed foods and fast food in diet.

## 2024-02-21 ENCOUNTER — Other Ambulatory Visit (RURAL_HEALTH_CENTER): Payer: Self-pay | Admitting: Family

## 2024-02-21 DIAGNOSIS — E559 Vitamin D deficiency, unspecified: Secondary | ICD-10-CM

## 2024-02-21 MED ORDER — ERGOCALCIFEROL (VITAMIN D2) 1,250 MCG (50,000 UNIT) CAPSULE: 50000.0000 [IU] | ORAL_CAPSULE | ORAL | 1 refills | Status: AC

## 2024-02-22 ENCOUNTER — Ambulatory Visit (RURAL_HEALTH_CENTER): Payer: Self-pay

## 2024-02-22 NOTE — Result Encounter Note (Signed)
 Patient informed and voiced understanding

## 2024-03-15 ENCOUNTER — Ambulatory Visit (INDEPENDENT_AMBULATORY_CARE_PROVIDER_SITE_OTHER): Admitting: Surgery

## 2024-03-17 ENCOUNTER — Ambulatory Visit (RURAL_HEALTH_CENTER): Payer: Self-pay | Admitting: Family

## 2024-03-23 ENCOUNTER — Encounter (RURAL_HEALTH_CENTER): Payer: Self-pay | Admitting: Family

## 2024-03-23 ENCOUNTER — Ambulatory Visit (RURAL_HEALTH_CENTER): Payer: Self-pay | Attending: Family | Admitting: Family

## 2024-03-23 ENCOUNTER — Other Ambulatory Visit: Payer: Self-pay

## 2024-03-23 VITALS — BP 132/72 | HR 71 | Temp 97.8°F | Wt 223.0 lb

## 2024-03-23 DIAGNOSIS — U071 COVID-19: Secondary | ICD-10-CM | POA: Insufficient documentation

## 2024-03-23 DIAGNOSIS — Z79899 Other long term (current) drug therapy: Secondary | ICD-10-CM | POA: Insufficient documentation

## 2024-03-23 HISTORY — DX: COVID-19: U07.1

## 2024-03-23 LAB — POCT RAPID FLU
INFLUENZA TYPE A: NEGATIVE
INFLUENZA TYPE B: NEGATIVE

## 2024-03-23 LAB — POCT RAPID STREP A: RAPID STREP A (POCT): NEGATIVE

## 2024-03-23 LAB — POCT RAPID COVID (SOFIA) (AMB ONLY): COVID-19 AG: POSITIVE — AB

## 2024-03-23 MED ORDER — NIRMATRELVIR 300 MG (150 MG X2)-RITONAVIR 100 MG TABLET,DOSE PACK
3.0000 | ORAL_TABLET | Freq: Two times a day (BID) | ORAL | 0 refills | Status: DC
Start: 2024-03-23 — End: 2024-03-29

## 2024-03-23 NOTE — Addendum Note (Signed)
 Addended by: Marne Meline BROOKLYN on: 03/23/2024 02:28 PM     Modules accepted: Orders

## 2024-03-23 NOTE — Nursing Note (Signed)
 Patient complains of sore throat, chills, cough, congestion, SOB, Fatigue, weak, and nausea x Saturday

## 2024-03-23 NOTE — Assessment & Plan Note (Signed)
 The patient was called for notification of a POSITIVE Covid Test.  Patient will start Paxlovid 3 tablets BID x 5 days.     Advise to hold rosuvastatin  while taking Paxlovid.     - Treatment does not require the use of an antibiotic.  Avoid anti-inflammatory medication such as Ibuprofen.  May take Tylenol as needed for fever and body aches.       - Per CDC guidelines, the patient may end home isolation when they have not had a fever for at least 72 hours without medication and other symptoms such as cough have improved and more than 10 days have passed since symptoms started and as recommended by health department protocols.  Isolation recommendations can be found on the CDC website and all necessary precautions should still be taken.      - Please let your close contacts within 48 hours of developing symptoms through now that you have tested positive so they can also quarantine.      - Wash hands often with soap and water for at least 20 seconds or alternatively use hand sanitizer with at least 60% alcohol content.      - Cover all coughs and sneezes.  Clean all high touch surfaces everyday such as door knobs and cell phones.      - Continue to monitor your symptoms.  Please contact your primary care provider with additional concerns or if your symptoms are persistent or worsening.  If you do not have a primary care provider, please contact our call center at [phone], Option 4 or contact your local urgent care center.      - Visit the CDC website for additional information.      - Please contact your employer regarding questions about return to work.      Donny LITTIE Clonts, APRN, CNP  03/23/2024, 14:17

## 2024-03-23 NOTE — Progress Notes (Signed)
 [facility] MEDICINE [facility]  [facility] FAMILY MEDICINE      Kelsey Pitts  MRN: [id number]  DOB: [birth year]  Date of Service: 03/23/2024    CHIEF COMPLAINT  Chief Complaint   Patient presents with    Feel Sick       SUBJECTIVE  Kelsey Pitts is a 68 y.o. female who presents to clinic for positive at home screening.  Reports worsening nausea, sinus congestion and runny nose x 5 days.   Patient denies fever.  Patient complains of nausea but denies vomiting or diarrhea.  Denies chest pain, palpitations or shortness of breath.  Reports she has been coughing but cough is nonproductive.Patient has been taking OTC aleve without relief of symptoms.   Reports she is eating and drinking well.  Reports exposure to sick contacts.  Denies recent hospitalizations or surgeries.     Review of Systems:  Positive ROS discussed in HPI, otherwise all other systems negative.      Medications:   Calcium Carbonate 500 mg calcium (1,250 mg) Oral Tablet, Chewable, every day  ergocalciferol , vitamin D2, (DRISDOL ) 1,250 mcg (50,000 unit) Oral Capsule, Take 1 Capsule (50,000 Units total) by mouth Every 7 days for 90 days Indications: low vitamin D  levels  loratadine  (CLARITIN ) 10 mg Oral Tablet, Take 1 Tablet (10 mg total) by mouth Daily for 90 days  metoprolol  succinate (TOPROL -XL) 25 mg Oral Tablet Sustained Release 24 hr, Take 1 Tablet (25 mg total) by mouth Daily for 90 days  midodrine (PROAMITINE) 5 mg Oral Tablet, Take 1 Tablet (5 mg total) by mouth Twice daily  rosuvastatin  (CRESTOR ) 5 mg Oral Tablet, Take 1 Tablet (5 mg total) by mouth Daily for 90 days  sertraline  (ZOLOFT ) 25 mg Oral Tablet, Take 1 Tablet (25 mg total) by mouth Daily for 30 days    No facility-administered medications prior to visit.      Allergies:   Allergies[1]      OBJECTIVE  BP 132/72 (Site: Right Arm, Patient Position: Sitting, Cuff Size: Adult Large)   Pulse 71   Temp 36.6 C (97.8 F)   Wt 101 kg (223 lb)   LMP  (LMP Unknown)   SpO2 98%    BMI 35.99 kg/m       Physical Exam  Vitals reviewed.   HENT:      Head: Normocephalic.      Right Ear: Tympanic membrane, ear canal and external ear normal.      Left Ear: Tympanic membrane, ear canal and external ear normal.      Nose: Mucosal edema, congestion and rhinorrhea present.      Right Turbinates: Swollen and pale.      Left Turbinates: Swollen and pale.      Right Sinus: No maxillary sinus tenderness or frontal sinus tenderness.      Left Sinus: No maxillary sinus tenderness or frontal sinus tenderness.      Mouth/Throat:      Lips: Pink.      Mouth: Mucous membranes are moist.      Pharynx: Oropharynx is clear. No oropharyngeal exudate or posterior oropharyngeal erythema.      Tonsils: No tonsillar exudate.   Eyes:      General: Lids are normal.      Extraocular Movements: Extraocular movements intact.   Cardiovascular:      Rate and Rhythm: Normal rate and regular rhythm.      Heart sounds: Normal heart sounds, S1 normal and S2 normal.  Lymphadenopathy:      Cervical: No cervical adenopathy.      Right cervical: No superficial cervical adenopathy.     Left cervical: No superficial cervical adenopathy.   Skin:     Coloration: Skin is not pale.   Neurological:      Coordination: Coordination is intact.      Gait: Gait is intact.   Psychiatric:         Behavior: Behavior is cooperative.         Cognition and Memory: Cognition normal.           ASSESSMENT/PLAN  (U07.1) COVID  (primary encounter diagnosis)       Problem List Items Addressed This Visit          Other    COVID - Primary    The patient was called for notification of a POSITIVE Covid Test.  Patient will start Paxlovid 3 tablets BID x 5 days.     Advise to hold rosuvastatin  while taking Paxlovid.     - Treatment does not require the use of an antibiotic.  Avoid anti-inflammatory medication such as Ibuprofen.  May take Tylenol as needed for fever and body aches.       - Per CDC guidelines, the patient may end home isolation when they have not  had a fever for at least 72 hours without medication and other symptoms such as cough have improved and more than 10 days have passed since symptoms started and as recommended by health department protocols.  Isolation recommendations can be found on the CDC website and all necessary precautions should still be taken.      - Please let your close contacts within 48 hours of developing symptoms through now that you have tested positive so they can also quarantine.      - Wash hands often with soap and water for at least 20 seconds or alternatively use hand sanitizer with at least 60% alcohol content.      - Cover all coughs and sneezes.  Clean all high touch surfaces everyday such as door knobs and cell phones.      - Continue to monitor your symptoms.  Please contact your primary care provider with additional concerns or if your symptoms are persistent or worsening.  If you do not have a primary care provider, please contact our call center at [phone], Option 4 or contact your local urgent care center.      - Visit the CDC website for additional information.      - Please contact your employer regarding questions about return to work.      Donny LITTIE Clonts, APRN, CNP  03/23/2024, 14:17             Orders Placed This Encounter    nirmatrelvir-ritonavir (PAXLOVID) 300 mg (150 mg x 2)-100mg  tablet therapy pack        No follow-ups on file.    Donny LITTIE Clonts, APRN, CNP 03/23/2024, 14:18       [1] No Known Allergies

## 2024-03-28 ENCOUNTER — Ambulatory Visit (RURAL_HEALTH_CENTER): Payer: Self-pay | Admitting: Family

## 2024-03-29 ENCOUNTER — Encounter (RURAL_HEALTH_CENTER): Payer: Self-pay | Admitting: Family

## 2024-03-29 ENCOUNTER — Ambulatory Visit (RURAL_HEALTH_CENTER): Payer: Self-pay | Attending: Family | Admitting: Family

## 2024-03-29 ENCOUNTER — Other Ambulatory Visit: Payer: Self-pay

## 2024-03-29 VITALS — BP 133/74 | HR 58 | Temp 97.7°F | Ht 66.0 in | Wt 227.0 lb

## 2024-03-29 DIAGNOSIS — E66812 Obesity, class 2: Secondary | ICD-10-CM | POA: Insufficient documentation

## 2024-03-29 DIAGNOSIS — E661 Drug-induced obesity: Secondary | ICD-10-CM | POA: Insufficient documentation

## 2024-03-29 DIAGNOSIS — F320 Major depressive disorder, single episode, mild: Secondary | ICD-10-CM | POA: Insufficient documentation

## 2024-03-29 DIAGNOSIS — Z7689 Persons encountering health services in other specified circumstances: Secondary | ICD-10-CM | POA: Insufficient documentation

## 2024-03-29 DIAGNOSIS — Z6836 Body mass index (BMI) 36.0-36.9, adult: Secondary | ICD-10-CM | POA: Insufficient documentation

## 2024-03-29 DIAGNOSIS — Z789 Other specified health status: Secondary | ICD-10-CM | POA: Insufficient documentation

## 2024-03-29 MED ORDER — SERTRALINE 25 MG TABLET
25.0000 mg | ORAL_TABLET | Freq: Every day | ORAL | 1 refills | Status: AC
Start: 2024-03-29 — End: 2024-06-27

## 2024-03-29 MED ORDER — WEGOVY 0.25 MG/0.5 ML SUBCUTANEOUS PEN INJECTOR: 0.2500 mg | PEN_INJECTOR | SUBCUTANEOUS | 0 refills | Status: DC

## 2024-03-29 NOTE — Nursing Note (Signed)
 Patient here for follow up on depression. Wants to discuss weight loss options

## 2024-03-29 NOTE — Patient Instructions (Signed)
 VISIT SUMMARY:  During your visit, we discussed your current health concerns, including your depression and weight management. You mentioned feeling better regarding your depression but experiencing some wheezing and difficulty sleeping. We also talked about your interest in weight loss and the potential benefits of medication to assist with this.    YOUR PLAN:  -OBESITY: Obesity is a condition characterized by excessive body fat that increases the risk of health problems. We will submit a request to your insurance for Wegovy (semaglutide) to help with weight loss. If approved, you will start on the lowest dose and gradually increase to a therapeutic range. It's important to stay hydrated, consume enough protein, and follow a low-fat diet to minimize side effects. Additionally, incorporating daily exercise into your routine is encouraged. We will have a follow-up appointment in 5 weeks to monitor your progress.    -DEPRESSION: Depression is a mood disorder that causes persistent feelings of sadness and loss of interest. Your depression is well-managed with your current dose of sertraline  (25 mg daily). We will continue this medication and provide a prescription for the next 90 days.    INSTRUCTIONS:  We will follow up in 5 weeks to monitor your weight management progress. Continue taking your sertraline  as prescribed and maintain the recommended lifestyle changes. If you experience any new symptoms or have concerns, please contact our office.

## 2024-03-29 NOTE — Progress Notes (Signed)
 [facility] MEDICINE [facility]  [facility] FAMILY MEDICINE      Kelsey Pitts  MRN: [id number]  DOB: April 30, 1956  Date of Service: 03/29/2024    CHIEF COMPLAINT  Chief Complaint   Patient presents with    Follow Up       SUBJECTIVE  History of Present Illness  Kelsey Pitts is a 68 year old female with depression who presents for follow-up.    She feels 'better' regarding her depression but mentions some nausea after starting medication. No thoughts of self-harm or harm to others. She experiences difficulty sleeping, describing it as a normal pattern of tossing and turning. She is currently taking Zoloft  at bedtime and does not feel the need for a dosage increase.  Denies SI/HI/AVH.     She wants to lose weight and has not been on any weight loss medications before. 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 She mentions having sleep apnea, high blood pressure, high cholesterol, and aortic valve stenosis.  Reports she does not exercise.  Reports she eats a regular diet, limiting fried foods, soda and sweets.    Family stressors contribute to her depression, including issues with her sisters in [place]  and her younger sister's gambling problem. She feels stressed by family dynamics and tries to distance herself from the situation.       Review of Systems:  Positive ROS discussed in HPI, otherwise all other systems negative.      Medications:   Calcium Carbonate 500 mg calcium (1,250 mg) Oral Tablet, Chewable, every day  ergocalciferol , vitamin D2, (DRISDOL ) 1,250 mcg (50,000 unit) Oral Capsule, Take 1 Capsule (50,000 Units total) by mouth Every 7 days for 90 days Indications: low vitamin D  levels  loratadine  (CLARITIN ) 10 mg Oral Tablet, Take 1 Tablet (10 mg total) by mouth Daily for 90 days  metoprolol  succinate (TOPROL -XL) 25 mg Oral Tablet Sustained Release 24 hr, Take 1 Tablet (25 mg  total) by mouth Daily for 90 days  midodrine (PROAMITINE) 5 mg Oral Tablet, Take 1 Tablet (5 mg total) by mouth Twice daily  rosuvastatin  (CRESTOR ) 5 mg Oral Tablet, Take 1 Tablet (5 mg total) by mouth Daily for 90 days  nirmatrelvir-ritonavir (PAXLOVID) 300 mg (150 mg x 2)-100mg  tablet therapy pack, Take 3 Tablets by mouth Twice daily for 5 days  sertraline  (ZOLOFT ) 25 mg Oral Tablet, Take 1 Tablet (25 mg total) by mouth Daily for 30 days    No facility-administered medications prior to visit.      Allergies:   Allergies[1]      OBJECTIVE  BP 133/74 (Site: Right Arm, Patient Position: Sitting, Cuff Size: Adult Large)   Pulse 58   Temp 36.5 C (97.7 F)   Ht 1.676 m (5' 6)   Wt 103 kg (227 lb)   LMP  (LMP Unknown)   SpO2 99%   BMI 36.64 kg/m     Physical Exam  Vitals reviewed.   Constitutional:       Appearance: She is obese.   Cardiovascular:      Rate and Rhythm: Normal rate and regular rhythm.      Pulses: Normal pulses.      Heart sounds: Normal heart sounds.   Pulmonary:      Effort: Pulmonary effort is normal.      Breath sounds: Normal breath sounds.   Neurological:  General: No focal deficit present.      Mental Status: She is alert and oriented to person, place, and time. Mental status is at baseline.   Psychiatric:         Mood and Affect: Mood normal.         Behavior: Behavior normal.            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 Educated about management of weight  Z78.9       4. Encounter for weight management  Z76.89            ASSESSMENT/PLAN  Assessment & Plan  Obesity/Weight Management  Discussed obesity management with interest in pharmacological assistance. Potential cardiovascular benefits may support insurance approval.  - Submit request for Wegovy (semaglutide) to insurance for approval.  - If approved,  initiate Wegovy at the lowest dose and titrate to a therapeutic range.  - Emphasize hydration, protein intake, and a low-fat diet to minimize side effects.  - Encourage healthy lifestyle changes, including daily exercise.  - Schedule follow-up appointment in 5 weeks for weight management.    Depression  Depression well-managed with current sertraline  dose.  - Continue sertraline  25 mg orally daily.  - Prescribe sertraline  for 90 days.          Orders Placed This Encounter    sertraline  (ZOLOFT ) 25 mg Oral Tablet    semaglutide (WEGOVY) 0.25 mg/0.5 mL Subcutaneous Pen Injector      Medications Discontinued During This Encounter   Medication Reason    nirmatrelvir-ritonavir (PAXLOVID) 300 mg (150 mg x 2)-100mg  tablet therapy pack Other    sertraline  (ZOLOFT ) 25 mg Oral Tablet Reorder       Return in about 5 weeks (around 05/03/2024) for weight management.    Kelsey LITTIE Clonts, APRN, CNP 03/29/2024, 13:29    This note was created with assistance from [employer] via capture of conversational audio. Consent was obtained from the patient and all parties present prior to recording.           [1] No Known Allergies

## 2024-04-05 ENCOUNTER — Ambulatory Visit (INDEPENDENT_AMBULATORY_CARE_PROVIDER_SITE_OTHER): Admitting: Surgery

## 2024-05-03 ENCOUNTER — Ambulatory Visit (RURAL_HEALTH_CENTER): Payer: Self-pay | Admitting: Family

## 2024-05-11 ENCOUNTER — Ambulatory Visit (RURAL_HEALTH_CENTER): Payer: Self-pay | Attending: Family | Admitting: Family

## 2024-05-11 ENCOUNTER — Encounter (RURAL_HEALTH_CENTER): Payer: Self-pay | Admitting: Family

## 2024-05-11 ENCOUNTER — Other Ambulatory Visit: Payer: Self-pay

## 2024-05-11 VITALS — BP 148/97 | HR 87 | Temp 97.8°F | Wt 229.0 lb

## 2024-05-11 DIAGNOSIS — J020 Streptococcal pharyngitis: Secondary | ICD-10-CM | POA: Insufficient documentation

## 2024-05-11 LAB — POCT RAPID COVID-19 & FLU (AMB ONLY)
COVID-19 AG: NEGATIVE
INFLUENZA TYPE A: NEGATIVE
INFLUENZA TYPE B: NEGATIVE

## 2024-05-11 LAB — POCT RAPID STREP A: RAPID STREP A (POCT): POSITIVE

## 2024-05-11 MED ORDER — DOXYCYCLINE HYCLATE 100 MG CAPSULE: 100.0000 mg | ORAL_CAPSULE | Freq: Two times a day (BID) | ORAL | 0 refills | Status: DC

## 2024-05-11 NOTE — Nursing Note (Signed)
 Patient complains of cough, sore throat, headache, fever, and sinus congestion since Monday

## 2024-05-11 NOTE — Progress Notes (Signed)
 [facility] MEDICINE [facility]  [facility] FAMILY MEDICINE      REKIA KUJALA  MRN: [id number]  DOB: 07/08/1955  Date of Service: 05/16/2024    CHIEF COMPLAINT  Chief Complaint   Patient presents with    Feel Sick       SUBJECTIVE  Kelsey NASER is a 68 y.o. female who presents to clinic for sore throat, headache, sinus congestion times 3 days.  Reports exposure to sick contacts.  Denies taking over-the-counter medications.  Denies fever, vomiting or diarrhea.  Denies chest pain, palpitations or shortness a breath.  Reports she is eating and drinking well.  Reports she work today.  Denies antibiotic use in the past 30 days.  Denies recent hospitalizations or surgeries..     Review of Systems:  Positive ROS discussed in HPI, otherwise all other systems negative.      Medications:   Calcium Carbonate 500 mg calcium (1,250 mg) Oral Tablet, Chewable, every day  ergocalciferol , vitamin D2, (DRISDOL ) 1,250 mcg (50,000 unit) Oral Capsule, Take 1 Capsule (50,000 Units total) by mouth Every 7 days for 90 days Indications: low vitamin D  levels  loratadine  (CLARITIN ) 10 mg Oral Tablet, Take 1 Tablet (10 mg total) by mouth Daily for 90 days  metoprolol  succinate (TOPROL -XL) 25 mg Oral Tablet Sustained Release 24 hr, Take 1 Tablet (25 mg total) by mouth Daily for 90 days  midodrine (PROAMITINE) 5 mg Oral Tablet, Take 1 Tablet (5 mg total) by mouth Twice daily  rosuvastatin  (CRESTOR ) 5 mg Oral Tablet, Take 1 Tablet (5 mg total) by mouth Daily for 90 days  semaglutide  (WEGOVY ) 0.25 mg/0.5 mL Subcutaneous Pen Injector, Inject 0.5 mL (0.25 mg total) under the skin Every 7 days Indications: cardiovascular event risk reduction in obesity, weight loss management for a person with obesity (Patient not taking: Reported on 05/11/2024)  sertraline  (ZOLOFT ) 25 mg Oral Tablet, Take 1 Tablet (25 mg total) by mouth Daily    No facility-administered medications prior to visit.      Allergies:   Allergies[1]      OBJECTIVE  BP (!)  148/97 (Site: Right Arm, Patient Position: Sitting, Cuff Size: Adult Large)   Pulse 87   Temp 36.6 C (97.8 F)   Wt 104 kg (229 lb)   LMP  (LMP Unknown)   SpO2 100%   BMI 36.96 kg/m       Physical Exam  Vitals reviewed.   Constitutional:       Appearance: She is obese.   HENT:      Head: Normocephalic.      Right Ear: Ear canal and external ear normal. Tympanic membrane is bulging.      Left Ear: Ear canal and external ear normal. Tympanic membrane is bulging.      Nose: Rhinorrhea present. Rhinorrhea is clear.      Right Turbinates: Pale.      Left Turbinates: Pale.      Right Sinus: No maxillary sinus tenderness or frontal sinus tenderness.      Left Sinus: No maxillary sinus tenderness or frontal sinus tenderness.      Mouth/Throat:      Lips: Pink.      Mouth: Mucous membranes are moist.      Dentition: Has dentures.      Pharynx: Posterior oropharyngeal erythema present. No oropharyngeal exudate.   Eyes:      Conjunctiva/sclera: Conjunctivae normal.   Neck:      Trachea: Trachea and phonation normal.  Cardiovascular:      Rate and Rhythm: Normal rate and regular rhythm.      Heart sounds: Normal heart sounds.   Pulmonary:      Effort: Pulmonary effort is normal.      Breath sounds: Normal breath sounds.   Musculoskeletal:      Cervical back: Normal range of motion.   Lymphadenopathy:      Cervical: Cervical adenopathy present.      Right cervical: Superficial cervical adenopathy present.      Left cervical: Superficial cervical adenopathy present.   Skin:     Findings: No rash.   Neurological:      Cranial Nerves: Cranial nerves 2-12 are intact.   Psychiatric:         Behavior: Behavior is cooperative.         Cognition and Memory: Cognition normal.           ASSESSMENT/PLAN  (J02.0) Strep pharyngitis  (primary encounter diagnosis)  Plan: POCT Rapid Covid & Flu Dalila), POCT Rapid         Strep       Problem List Items Addressed This Visit          Digestive    Strep pharyngitis - Primary    Start  doxycycline  100mg .o. b.i.d. times 10 days for strep pharyngitis.  Complete antibiotic therapy as prescribed.  Advise over-the-counter Tylenol/Motrin as needed.  Hydrate well. Discussed strict hand washing.  Avoid sharing food/drinks.  Patient will change toothbrush after 2 full days of antibiotic therapy.  Advise to follow-up if symptoms progress or do not resolve.    COVID screening is negative.  Negative flu screening.  Patient verbalized understanding.            Relevant Orders    POCT Rapid Covid & Flu Dalila) (Completed)    POCT Rapid Strep (Completed)      Orders Placed This Encounter    POCT Rapid Covid & Flu Dalila)    POCT Rapid Strep    doxycycline  hyclate (VIBRAMYCIN ) 100 mg Oral Capsule        Return if symptoms worsen or fail to improve.    Donny LITTIE Clonts, APRN, CNP 05/16/2024, 15:17         [1] No Known Allergies

## 2024-05-11 NOTE — Assessment & Plan Note (Addendum)
 Start doxycycline  100mg .o. b.i.d. times 10 days for strep pharyngitis.  Complete antibiotic therapy as prescribed.  Advise over-the-counter Tylenol/Motrin as needed.  Hydrate well. Discussed strict hand washing.  Avoid sharing food/drinks.  Patient will change toothbrush after 2 full days of antibiotic therapy.  Advise to follow-up if symptoms progress or do not resolve.    COVID screening is negative.  Negative flu screening.  Patient verbalized understanding.

## 2024-05-17 ENCOUNTER — Ambulatory Visit (RURAL_HEALTH_CENTER): Admitting: Family

## 2024-05-19 ENCOUNTER — Ambulatory Visit (RURAL_HEALTH_CENTER): Payer: Self-pay | Admitting: Family

## 2024-06-01 ENCOUNTER — Ambulatory Visit (RURAL_HEALTH_CENTER): Payer: Self-pay | Attending: Family | Admitting: Family

## 2024-06-01 VITALS — BP 134/80 | HR 56 | Temp 97.7°F | Ht 66.0 in | Wt 230.0 lb

## 2024-06-01 DIAGNOSIS — M545 Low back pain, unspecified: Secondary | ICD-10-CM | POA: Insufficient documentation

## 2024-06-01 LAB — POCT URINE DIPSTICK
BLOOD: NEGATIVE
GLUCOSE: NEGATIVE
KETONE: NEGATIVE
NITRITE: NEGATIVE
PH: 6
PROTEIN: NEGATIVE
SPECIFIC GRAVITY: 1.015
UROBILINOGEN: 0.2

## 2024-06-01 NOTE — Nursing Note (Signed)
 Patient complains of bilateral hip pain and back pain- on going. Hip pain x few days

## 2024-06-01 NOTE — Progress Notes (Signed)
 [facility] MEDICINE [facility]  [facility] FAMILY MEDICINE      Kelsey Pitts  MRN: [id number]  DOB: [birth year]  Date of Service: 06/01/2024    CHIEF COMPLAINT  Chief Complaint   Patient presents with    Hip Pain    Back Pain       SUBJECTIVE  Kelsey Pitts is a 68 y.o. female who presents to clinic for low back pain radiating to bilateral hips time 2 weeks.  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  Patient denies loss of control of bowel or bladder.  Denies swelling, redness or bruising to site.  Patient denies falls or specific injury.  Denies history of spinal surgery.  Patient reports when she gets home from work she does rest, takes Tylenol and uses a heating pad which does relieve symptoms.  Patient reports she is not interested in physical therapy or x-rays at this time.  Patient is requesting a steroid injection to relieve discomfort..     Review of Systems:  Positive ROS discussed in HPI, otherwise all other systems negative.      Medications:   Calcium Carbonate 500 mg calcium (1,250 mg) Oral Tablet, Chewable, every day  ergocalciferol , vitamin D2, (DRISDOL ) 1,250 mcg (50,000 unit) Oral Capsule, Take 1 Capsule (50,000 Units total) by mouth Every 7 days for 90 days Indications: low vitamin D  levels  loratadine  (CLARITIN ) 10 mg Oral Tablet, Take 1 Tablet (10 mg total) by mouth Daily for 90 days  metoprolol  succinate (TOPROL -XL) 25 mg Oral Tablet Sustained Release 24 hr, Take 1 Tablet (25 mg total) by mouth Daily for 90 days  midodrine (PROAMITINE) 5 mg Oral Tablet, Take 1 Tablet (5 mg total) by mouth Twice daily  rosuvastatin  (CRESTOR ) 5 mg Oral Tablet, Take 1 Tablet (5 mg total) by mouth Daily for 90 days  sertraline  (ZOLOFT ) 25 mg Oral Tablet, Take 1 Tablet (25 mg total) by mouth Daily  doxycycline  hyclate (VIBRAMYCIN ) 100 mg Oral Capsule, Take 1 Capsule (100 mg total) by mouth Twice daily for 10  days  semaglutide  (WEGOVY ) 0.25 mg/0.5 mL Subcutaneous Pen Injector, Inject 0.5 mL (0.25 mg total) under the skin Every 7 days Indications: cardiovascular event risk reduction in obesity, weight loss management for a person with obesity (Patient not taking: Reported on 05/11/2024)    No facility-administered medications prior to visit.      Allergies:   Allergies[1]      OBJECTIVE  BP 134/80 (Site: Right Arm, Patient Position: Sitting, Cuff Size: Adult Large)   Pulse 56   Temp 36.5 C (97.7 F)   Ht 1.676 m (5' 6)   Wt 104 kg (230 lb)   LMP  (LMP Unknown)   SpO2 96%   BMI 37.12 kg/m       Physical Exam  Constitutional:       Appearance: She is obese.   Cardiovascular:      Rate and Rhythm: Normal rate.      Pulses: Normal pulses.      Heart sounds: Normal heart sounds.   Musculoskeletal:      Lumbar back: No deformity, signs of trauma, tenderness or bony tenderness. Normal range of motion. Negative right straight leg raise test and negative left straight leg raise test.      Right hip: Normal. No bony tenderness. Normal range of motion.      Left hip:  Normal. No bony tenderness. Normal range of motion.   Skin:     General: Skin is warm.   Neurological:      General: No focal deficit present.      Mental Status: She is alert.           ASSESSMENT/PLAN  (M54.50) Low back pain  (primary encounter diagnosis)       Problem List Items Addressed This Visit          Musculoskeletal    Low back pain - Primary    Kenalog  40 mg IM given today.  Advised over-the-counter Tylenol/Motrin as needed.  Advised conservative measures such as rest, warm Sitz baths and heating pad for 20 minutes after activity.  Patient declined physical therapy and declined x-rays of lumbar spine or bilateral hips.  Advise to avoid pushing, pulling or lifting to decrease strain on lower back.  Advised to follow up as needed.  Patient agreed with plan of care.           No orders of the defined types were placed in this encounter.       No  follow-ups on file.    Donny LITTIE Clonts, APRN, CNP 06/01/2024, 12:29       [1] No Known Allergies

## 2024-06-01 NOTE — Addendum Note (Signed)
 Addended by: Imani Sherrin BROOKLYN on: 06/01/2024 03:17 PM     Modules accepted: Orders

## 2024-06-01 NOTE — Assessment & Plan Note (Signed)
 Kenalog  40 mg IM given today.  Advised over-the-counter Tylenol/Motrin as needed.  Advised conservative measures such as rest, warm Sitz baths and heating pad for 20 minutes after activity.  Patient declined physical therapy and declined x-rays of lumbar spine or bilateral hips.  Advise to avoid pushing, pulling or lifting to decrease strain on lower back.  Advised to follow up as needed.  Patient agreed with plan of care.

## 2024-06-07 ENCOUNTER — Ambulatory Visit (RURAL_HEALTH_CENTER): Admitting: Family

## 2024-06-21 ENCOUNTER — Encounter (RURAL_HEALTH_CENTER): Payer: Self-pay | Admitting: Family

## 2024-07-11 ENCOUNTER — Ambulatory Visit (RURAL_HEALTH_CENTER): Payer: Self-pay | Admitting: Family

## 2024-08-01 ENCOUNTER — Ambulatory Visit (RURAL_HEALTH_CENTER): Admitting: Family
